# Patient Record
Sex: Male | Born: 1950 | Race: Black or African American | Hispanic: No | Marital: Married | Smoking: Current every day smoker
Health system: Southern US, Community
[De-identification: ages and names within clinical notes are randomized; demographics above are authoritative.]

## PROBLEM LIST (undated history)

## (undated) DIAGNOSIS — D509 Iron deficiency anemia, unspecified: Secondary | ICD-10-CM

## (undated) DIAGNOSIS — I639 Cerebral infarction, unspecified: Secondary | ICD-10-CM

## (undated) DIAGNOSIS — I10 Essential (primary) hypertension: Secondary | ICD-10-CM

## (undated) DIAGNOSIS — E785 Hyperlipidemia, unspecified: Secondary | ICD-10-CM

## (undated) DIAGNOSIS — Z8673 Personal history of transient ischemic attack (TIA), and cerebral infarction without residual deficits: Secondary | ICD-10-CM

## (undated) DIAGNOSIS — K5900 Constipation, unspecified: Secondary | ICD-10-CM

## (undated) DIAGNOSIS — Z720 Tobacco use: Secondary | ICD-10-CM

## (undated) HISTORY — DX: Iron deficiency anemia, unspecified: D50.9

## (undated) HISTORY — DX: Constipation, unspecified: K59.00

## (undated) HISTORY — DX: Cerebral infarction, unspecified: I63.9

## (undated) HISTORY — DX: Tobacco use: Z72.0

## (undated) HISTORY — DX: Essential (primary) hypertension: I10

## (undated) HISTORY — DX: Personal history of transient ischemic attack (TIA), and cerebral infarction without residual deficits: Z86.73

## (undated) HISTORY — DX: Hyperlipidemia, unspecified: E78.5

---

## 2013-08-23 ENCOUNTER — Inpatient Hospital Stay (HOSPITAL_BASED_OUTPATIENT_CLINIC_OR_DEPARTMENT_OTHER)
Admission: EM | Admit: 2013-08-23 | Discharge: 2013-08-25 | DRG: 066 | Disposition: A | Discharge status: Home / Self Care | Payer: 59 | Attending: Internal Medicine | Admitting: Internal Medicine

## 2013-08-23 ENCOUNTER — Emergency Department (HOSPITAL_BASED_OUTPATIENT_CLINIC_OR_DEPARTMENT_OTHER): Payer: 59

## 2013-08-23 ENCOUNTER — Encounter (HOSPITAL_BASED_OUTPATIENT_CLINIC_OR_DEPARTMENT_OTHER): Payer: Self-pay | Admitting: Emergency Medicine

## 2013-08-23 ENCOUNTER — Observation Stay (HOSPITAL_COMMUNITY): Payer: 59

## 2013-08-23 DIAGNOSIS — F17200 Nicotine dependence, unspecified, uncomplicated: Secondary | ICD-10-CM | POA: Diagnosis present

## 2013-08-23 DIAGNOSIS — D72829 Elevated white blood cell count, unspecified: Secondary | ICD-10-CM | POA: Diagnosis present

## 2013-08-23 DIAGNOSIS — R209 Unspecified disturbances of skin sensation: Secondary | ICD-10-CM | POA: Diagnosis present

## 2013-08-23 DIAGNOSIS — G459 Transient cerebral ischemic attack, unspecified: Secondary | ICD-10-CM

## 2013-08-23 DIAGNOSIS — I10 Essential (primary) hypertension: Secondary | ICD-10-CM

## 2013-08-23 DIAGNOSIS — I519 Heart disease, unspecified: Secondary | ICD-10-CM

## 2013-08-23 DIAGNOSIS — I639 Cerebral infarction, unspecified: Secondary | ICD-10-CM

## 2013-08-23 DIAGNOSIS — I6340 Cerebral infarction due to embolism of unspecified cerebral artery: Principal | ICD-10-CM | POA: Diagnosis present

## 2013-08-23 DIAGNOSIS — R29898 Other symptoms and signs involving the musculoskeletal system: Secondary | ICD-10-CM

## 2013-08-23 DIAGNOSIS — D649 Anemia, unspecified: Secondary | ICD-10-CM

## 2013-08-23 HISTORY — DX: Cerebral infarction, unspecified: I63.9

## 2013-08-23 LAB — RAPID URINE DRUG SCREEN, HOSP PERFORMED
Amphetamines: NOT DETECTED
BARBITURATES: NOT DETECTED
BENZODIAZEPINES: NOT DETECTED
COCAINE: NOT DETECTED
Opiates: NOT DETECTED
TETRAHYDROCANNABINOL: NOT DETECTED

## 2013-08-23 LAB — URINALYSIS, ROUTINE W REFLEX MICROSCOPIC
BILIRUBIN URINE: NEGATIVE
Glucose, UA: NEGATIVE mg/dL
HGB URINE DIPSTICK: NEGATIVE
Ketones, ur: NEGATIVE mg/dL
Leukocytes, UA: NEGATIVE
Nitrite: NEGATIVE
PH: 6 (ref 5.0–8.0)
Protein, ur: NEGATIVE mg/dL
SPECIFIC GRAVITY, URINE: 1.019 (ref 1.005–1.030)
UROBILINOGEN UA: 1 mg/dL (ref 0.0–1.0)

## 2013-08-23 LAB — CBC
HCT: 31.6 % — ABNORMAL LOW (ref 39.0–52.0)
HCT: 29.9 % — ABNORMAL LOW (ref 39.0–52.0)
Hemoglobin: 9.3 g/dL — ABNORMAL LOW (ref 13.0–17.0)
Hemoglobin: 8.5 g/dL — ABNORMAL LOW (ref 13.0–17.0)
MCH: 21.8 pg — AB (ref 26.0–34.0)
MCH: 20.6 pg — AB (ref 26.0–34.0)
MCHC: 29.4 g/dL — AB (ref 30.0–36.0)
MCHC: 28.4 g/dL — ABNORMAL LOW (ref 30.0–36.0)
MCV: 74 fL — ABNORMAL LOW (ref 78.0–100.0)
MCV: 72.4 fL — AB (ref 78.0–100.0)
PLATELETS: 367 10*3/uL (ref 150–400)
PLATELETS: 299 10*3/uL (ref 150–400)
RBC: 4.27 MIL/uL (ref 4.22–5.81)
RBC: 4.13 MIL/uL — AB (ref 4.22–5.81)
RDW: 18.5 % — AB (ref 11.5–15.5)
RDW: 17.9 % — ABNORMAL HIGH (ref 11.5–15.5)
WBC: 11.7 10*3/uL — ABNORMAL HIGH (ref 4.0–10.5)
WBC: 13.5 10*3/uL — ABNORMAL HIGH (ref 4.0–10.5)

## 2013-08-23 LAB — GLUCOSE, CAPILLARY
GLUCOSE-CAPILLARY: 115 mg/dL — AB (ref 70–99)
Glucose-Capillary: 110 mg/dL — ABNORMAL HIGH (ref 70–99)
Glucose-Capillary: 137 mg/dL — ABNORMAL HIGH (ref 70–99)

## 2013-08-23 LAB — HEMOGLOBIN A1C
Hgb A1c MFr Bld: 5.8 % — ABNORMAL HIGH (ref <5.7)
Hgb A1c MFr Bld: 5.9 % — ABNORMAL HIGH (ref <5.7)
MEAN PLASMA GLUCOSE: 123 mg/dL — AB (ref <117)
Mean Plasma Glucose: 120 mg/dL — ABNORMAL HIGH (ref <117)

## 2013-08-23 LAB — DIFFERENTIAL
BASOS ABS: 0.1 10*3/uL (ref 0.0–0.1)
BASOS PCT: 0 % (ref 0–1)
EOS ABS: 0.1 10*3/uL (ref 0.0–0.7)
Eosinophils Relative: 1 % (ref 0–5)
Lymphocytes Relative: 20 % (ref 12–46)
Lymphs Abs: 2.4 10*3/uL (ref 0.7–4.0)
Monocytes Absolute: 1 10*3/uL (ref 0.1–1.0)
Monocytes Relative: 9 % (ref 3–12)
NEUTROS ABS: 8.1 10*3/uL — AB (ref 1.7–7.7)
NEUTROS PCT: 69 % (ref 43–77)

## 2013-08-23 LAB — ETHANOL: Alcohol, Ethyl (B): <11 mg/dL (ref 0–11)

## 2013-08-23 LAB — CREATININE, SERUM
Creatinine, Ser: 0.7 mg/dL (ref 0.50–1.35)
GFR calc Af Amer: >90 mL/min (ref >90)
GFR calc non Af Amer: >90 mL/min (ref >90)

## 2013-08-23 LAB — COMPREHENSIVE METABOLIC PANEL
ALBUMIN: 3.9 g/dL (ref 3.5–5.2)
ALT: 17 U/L (ref 0–53)
AST: 31 U/L (ref 0–37)
Alkaline Phosphatase: 96 U/L (ref 39–117)
BUN: 8 mg/dL (ref 6–23)
CALCIUM: 9.3 mg/dL (ref 8.4–10.5)
CO2: 20 mEq/L (ref 19–32)
CREATININE: 0.8 mg/dL (ref 0.50–1.35)
Chloride: 102 mEq/L (ref 96–112)
GFR calc Af Amer: >90 mL/min (ref >90)
GFR calc non Af Amer: >90 mL/min (ref >90)
Glucose, Bld: 148 mg/dL — ABNORMAL HIGH (ref 70–99)
Potassium: 4.1 mEq/L (ref 3.7–5.3)
Sodium: 141 mEq/L (ref 137–147)
Total Bilirubin: 0.4 mg/dL (ref 0.3–1.2)
Total Protein: 8.5 g/dL — ABNORMAL HIGH (ref 6.0–8.3)

## 2013-08-23 LAB — PROTIME-INR
INR: 1.08 (ref 0.00–1.49)
PROTHROMBIN TIME: 13.8 s (ref 11.6–15.2)

## 2013-08-23 LAB — APTT: APTT: 25 s (ref 24–37)

## 2013-08-23 MED ORDER — MORPHINE SULFATE 2 MG/ML IJ SOLN
2.0000 mg | Freq: Once | INTRAMUSCULAR | Status: AC
  Administered 2013-08-23: 2 mg via INTRAVENOUS
  Filled 2013-08-23: qty 1

## 2013-08-23 MED ORDER — FAMOTIDINE 20 MG PO TABS
20.0000 mg | ORAL_TABLET | Freq: Two times a day (BID) | ORAL | Status: DC | PRN
  Filled 2013-08-23: qty 1

## 2013-08-23 MED ORDER — ENOXAPARIN SODIUM 40 MG/0.4ML ~~LOC~~ SOLN
40.0000 mg | Freq: Every day | SUBCUTANEOUS | Status: DC
  Administered 2013-08-23 – 2013-08-25 (×3): 40 mg via SUBCUTANEOUS
  Filled 2013-08-23 (×4): qty 0.4

## 2013-08-23 MED ORDER — SODIUM CHLORIDE 0.9 % IV SOLN: INTRAVENOUS | Status: DC

## 2013-08-23 MED ORDER — PNEUMOCOCCAL VAC POLYVALENT 25 MCG/0.5ML IJ INJ
0.5000 mL | INJECTION | INTRAMUSCULAR | Status: AC
  Administered 2013-08-24: 0.5 mL via INTRAMUSCULAR
  Filled 2013-08-23: qty 0.5

## 2013-08-23 MED ORDER — ASPIRIN EC 81 MG PO TBEC
81.0000 mg | DELAYED_RELEASE_TABLET | Freq: Every day | ORAL | Status: DC
  Administered 2013-08-23 – 2013-08-25 (×3): 81 mg via ORAL
  Filled 2013-08-23 (×3): qty 1

## 2013-08-23 NOTE — ED Notes (Signed)
[facility] here for pt transport

## 2013-08-23 NOTE — Progress Notes (Signed)
Report received from [place] in the ER.  Awaiting patients arrival to 3W31.  Dirk Dress 08/23/2013 8:07 AM

## 2013-08-23 NOTE — Progress Notes (Signed)
VASCULAR LAB PRELIMINARY  PRELIMINARY  PRELIMINARY  PRELIMINARY  Carotid duplex completed.    Preliminary report:  Bilateral:  1-39% ICA stenosis.  Vertebral artery flow is antegrade.     [place], [place] 08/23/2013, 12:48 PM

## 2013-08-23 NOTE — Progress Notes (Signed)
Follow up note:   MRI showing several small, acute infarcts in the bilateral frontal lobes and right parietal and occipital lobes, suggestive of emboli. Spoke with Dr Leonie Man of Neurology, recommended TEE. Cardiology has been consulted.

## 2013-08-23 NOTE — Consult Note (Addendum)
Referring Physician: [facility]    Chief Complaint: CODE STROKE: RIGHT FOREARM-HAND NUMBNESS AND WEAKNESS.  HPI:                                                                                                                                         Kirk Carson is an 63 y.o. male, right handed, unknown past medical history as he hasn't see a physician in many years, smoker, brought in by EMS as a code stroke due to acute onset of the above stated symptoms. Initial evaluation took place at [facility] and transferred to [facility] for further management. Never had similar symptoms before. He indicated that he was at work when around 4 am developed sudden onset of numbness from the right elbow down to the entire forearm-hand which was associated with inability to use the right hand. Had a " tight" sensation in the right upper abdomen. Stated that those symptoms rapidly improved and went away approximately 1 hour after onset. There was not associated weakness-numbness of the right face or right LE, no HA, vertigo, double vision, difficulty swallowing, slurred speech, imbalance, language or visual disturbances.  Upon arrival to [facility] had NIHSS 0. CT brain showed no acute abnormality.  Date last known well: 08/23/13 Time last known well: 4 am tPA Given: no, symptoms resolved NIHSS: 0 MRS: 0  History reviewed. No pertinent past medical history.  History reviewed. No pertinent past surgical history.  History reviewed. No pertinent family history. Social History:  reports that he has never smoked. He does not have any smokeless tobacco history on file. He reports that he does not drink alcohol or use illicit drugs.  Allergies: Allergies not on file  Medications:                                                                                                                           I have reviewed the patient's current medications.  ROS:  History obtained from the patient  General ROS: negative for - chills, fatigue, fever, night sweats, weight gain or weight loss Psychological ROS: negative for - behavioral disorder, hallucinations, memory difficulties, mood swings or suicidal ideation Ophthalmic ROS: negative for - blurry vision, double vision, eye pain or loss of vision ENT ROS: negative for - epistaxis, nasal discharge, oral lesions, sore throat, tinnitus or vertigo Allergy and Immunology ROS: negative for - hives or itchy/watery eyes Hematological and Lymphatic ROS: negative for - bleeding problems, bruising or swollen lymph nodes Endocrine ROS: negative for - galactorrhea, hair pattern changes, polydipsia/polyuria or temperature intolerance Respiratory ROS: negative for - cough, hemoptysis, shortness of breath or wheezing Cardiovascular ROS: negative for - chest pain, dyspnea on exertion, edema or irregular heartbeat Gastrointestinal ROS: negative for - abdominal pain, diarrhea, hematemesis, nausea/vomiting or stool incontinence Genito-Urinary ROS: negative for - dysuria, hematuria, incontinence or urinary frequency/urgency Musculoskeletal ROS: negative for - joint swelling or muscular weakness Neurological ROS: as noted in HPI Dermatological ROS: negative for rash and skin lesion changes  Physical exam: pleasant male in no apparent distress. Blood pressure 164/79, pulse 103, temperature 98.1 F (36.7 C), temperature source Oral, resp. rate 24, height _0  (1.702 m), weight 77.111 kg (170 lb), SpO2 100.00%. Head: normocephalic. Neck: supple, no bruits, no JVD. Cardiac: no murmurs. Lungs: clear. Abdomen: soft, no tender, no mass. Extremities: no edema. CV: pulses palpable throughout  Neurologic Examination:                                                                                                      Mental Status: Alert, oriented, thought content  appropriate.  Speech fluent without evidence of aphasia.  Able to follow 3 step commands without difficulty. Cranial Nerves: II: Discs flat bilaterally; Visual fields grossly normal, pupils equal, round, reactive to light and accommodation III,IV, VI: ptosis not present, extra-ocular motions intact bilaterally V,VII: smile symmetric, facial light touch sensation normal bilaterally VIII: hearing normal bilaterally IX,X: gag reflex present XI: bilateral shoulder shrug XII: midline tongue extension without atrophy or fasciculations  Motor: Right : Upper extremity   5/5    Left:     Upper extremity   5/5  Lower extremity   5/5     Lower extremity   5/5 Tone and bulk:normal tone throughout; no atrophy noted Sensory: Pinprick and light touch intact throughout, bilaterally Deep Tendon Reflexes:  Right: Upper Extremity   Left: Upper extremity   biceps (C-5 to C-6) 2/4   biceps (C-5 to C-6) 2/4 tricep (C7) 2/4    triceps (C7) 2/4 Brachioradialis (C6) 2/4  Brachioradialis (C6) 2/4  Lower Extremity Lower Extremity  quadriceps (L-2 to L-4) 2/4   quadriceps (L-2 to L-4) 2/4 Achilles (S1) 2/4   Achilles (S1) 2/4  Plantars: Right: downgoing   Left: downgoing Cerebellar: normal finger-to-nose,  normal heel-to-shin test Gait:  No tested.   Results for orders placed during the hospital encounter of 08/23/13 (from the past 48 hour(s))  ETHANOL     Status: None   Collection Time    08/23/13  4:45 AM      Result Value Ref Range   Alcohol, Ethyl (B) <11  0 - 11 mg/dL   Comment:            LOWEST DETECTABLE LIMIT FOR     SERUM ALCOHOL IS 11 mg/dL     FOR MEDICAL PURPOSES ONLY  PROTIME-INR     Status: None   Collection Time    08/23/13  4:45 AM      Result Value Ref Range   Prothrombin Time 13.8  11.6 - 15.2 seconds   INR 1.08  0.00 - 1.49  APTT     Status: None   Collection Time    08/23/13  4:45 AM      Result Value Ref Range   aPTT 25  24 - 37 seconds  CBC     Status: Abnormal    Collection Time    08/23/13  4:45 AM      Result Value Ref Range   WBC 11.7 (*) 4.0 - 10.5 K/uL   RBC 4.27  4.22 - 5.81 MIL/uL   Hemoglobin 9.3 (*) 13.0 - 17.0 g/dL   HCT 31.6 (*) 39.0 - 52.0 %   MCV 74.0 (*) 78.0 - 100.0 fL   MCH 21.8 (*) 26.0 - 34.0 pg   MCHC 29.4 (*) 30.0 - 36.0 g/dL   RDW 18.5 (*) 11.5 - 15.5 %   Platelets 367  150 - 400 K/uL  DIFFERENTIAL     Status: Abnormal   Collection Time    08/23/13  4:45 AM      Result Value Ref Range   Neutrophils Relative % 69  43 - 77 %   Neutro Abs 8.1 (*) 1.7 - 7.7 K/uL   Lymphocytes Relative 20  12 - 46 %   Lymphs Abs 2.4  0.7 - 4.0 K/uL   Monocytes Relative 9  3 - 12 %   Monocytes Absolute 1.0  0.1 - 1.0 K/uL   Eosinophils Relative 1  0 - 5 %   Eosinophils Absolute 0.1  0.0 - 0.7 K/uL   Basophils Relative 0  0 - 1 %   Basophils Absolute 0.1  0.0 - 0.1 K/uL   WBC Morphology WHITE COUNT CONFIRMED ON SMEAR     RBC Morphology STOMATOCYTES     Comment: ELLIPTOCYTES   Smear Review PLATELET COUNT CONFIRMED BY SMEAR    COMPREHENSIVE METABOLIC PANEL     Status: Abnormal   Collection Time    08/23/13  4:45 AM      Result Value Ref Range   Sodium 141  137 - 147 mEq/L   Potassium 4.1  3.7 - 5.3 mEq/L   Chloride 102  96 - 112 mEq/L   CO2 20  19 - 32 mEq/L   Glucose, Bld 148 (*) 70 - 99 mg/dL   BUN 8  6 - 23 mg/dL   Creatinine, Ser 0.80  0.50 - 1.35 mg/dL   Calcium 9.3  8.4 - 10.5 mg/dL   Total Protein 8.5 (*) 6.0 - 8.3 g/dL   Albumin 3.9  3.5 - 5.2 g/dL   AST 31  0 - 37 U/L   ALT 17  0 - 53 U/L   Alkaline Phosphatase 96  39 - 117 U/L   Total Bilirubin 0.4  0.3 - 1.2 mg/dL   GFR calc non Af Amer >90  >90 mL/min   GFR calc Af Amer >90  >90 mL/min   Comment: (NOTE)  The eGFR has been calculated using the CKD EPI equation.     This calculation has not been validated in all clinical situations.     eGFR's persistently <90 mL/min signify possible Chronic Kidney     Disease.   Ct Head Wo Contrast  08/23/2013   CLINICAL DATA:   Code stroke. Right arm pain and weakness onset 45 min ago.  EXAM: CT HEAD WITHOUT CONTRAST  TECHNIQUE: Contiguous axial images were obtained from the base of the skull through the vertex without intravenous contrast.  COMPARISON:  None.  FINDINGS: Mild cerebral atrophy and white matter changes. No ventricular dilatation. No mass effect or midline shift. No abnormal extra-axial fluid collections. Gray-white matter junctions are distinct. Basal cisterns are not effaced. No evidence of acute intracranial hemorrhage. No depressed skull fractures. Visualized paranasal sinuses and mastoid air cells are not opacified.  IMPRESSION: No acute intracranial abnormalities.  Code stroke results called to Dr. Lita Mains at [facility] hr on 08/23/2013.   Electronically Signed   By: Lucienne Capers M.D.   On: 08/23/2013 05:03    Assessment: 63 y.o. male with possible left brain TIA characterized by right forearm-arm numbness and had weakness. Differential includes an entrapment neuropathy, although he said he never had similar similar symptoms and the onset was rather abrupt which speaks in favor or a cerebrovascular event. No a candidate for thrombolysis due to symptoms resolution. ABCD2 score 6. Admit to medicine. ASA. Complete TIA work up.  Stroke Risk Factors - smoking  Plan: 1. HgbA1c, fasting lipid panel 2. MRI, MRA  of the brain without contrast 3. Echocardiogram 4. Carotid dopplers 5. Prophylactic therapy-aspirin 81 mg daily 6. Risk factor modification 7. Telemetry monitoring 8. Frequent neuro checks 9. PT/OT SLP ( no needed at this time, symptoms had resolved)  Dorian Pod, MD [facility] [phone]  08/23/2013, 5:44 AM

## 2013-08-23 NOTE — H&P (Addendum)
[facility] History and Physical  Lawayne Munyon [id number] DOB: 1950-07-26 DOA: 08/23/2013  Referring physician:  PCP: Pcp Not In System   Chief Complaint: Right upper extremity paresthesias  HPI: Aquarius Purdie is a 63 y.o. male with no significant past medical history who was brought to the emergency room by EMS this morning with complaints of right upper extremity numbness and tingling. He does not have a primary care provider, denies significant medical problems, presently not on prescription medications, who was in his usual state health until this morning where he noted numbness and tingling over his right elbow and forearm, associated with right hand weakness. Symptoms occurred at 4:00 this morning while at home. He denied prior episodes. Symptoms lasted for approximately 1 hour. Patient was brought to the emergency room by EMS as a code CVA was called. Patient's symptoms resolving by the time he reached the emergency department. Initial workup included an EKG which showed sinus rhythm. A CT scan of brain without contrast showed no acute intracranial abnormality.                                                                                                                       Review of Systems:  Constitutional:  No weight loss, night sweats, Fevers, chills, fatigue.  HEENT:  No headaches, Difficulty swallowing,Tooth/dental problems,Sore throat,  No sneezing, itching, ear ache, nasal congestion, post nasal drip,  Cardio-vascular:  No chest pain, Orthopnea, PND, swelling in lower extremities, anasarca, dizziness, palpitations  GI:  No heartburn, indigestion, abdominal pain, nausea, vomiting, diarrhea, change in bowel habits, loss of appetite  Resp:  No shortness of breath with exertion or at rest. No excess mucus, no productive cough, No non-productive cough, No coughing up of blood.No change in color of mucus.No wheezing.No chest wall deformity  Skin:  no rash or  lesions.  GU:  no dysuria, change in color of urine, no urgency or frequency. No flank pain.  Musculoskeletal:  No joint pain or swelling. No decreased range of motion. No back pain.  Psych:  No change in mood or affect. No depression or anxiety. No memory loss.   History reviewed. No pertinent past medical history. History reviewed. No pertinent past surgical history. Social History:  reports that he has never smoked. He does not have any smokeless tobacco history on file. He reports that he does not drink alcohol or use illicit drugs.  No Known Allergies  History reviewed. No pertinent family history.   Prior to Admission medications   Medication Sig Start Date End Date Taking? Authorizing Provider  acetaminophen (TYLENOL) 325 MG tablet Take 650 mg by mouth every 6 (six) hours as needed for mild pain.   Yes Historical Provider, MD   Physical Exam: Filed Vitals:   08/23/13 0537  BP: 164/79  Pulse: 103  Temp:   Resp: 24    BP 164/79  Pulse 103  Temp(Src) 98.1 F (36.7 C) (Oral)  Resp 24  Ht 5\' 7"  (1.702 m)  Wt 77.111 kg (170 lb)  BMI 26.62 kg/m2  SpO2 100%  General:  Appears calm and comfortable Eyes: PERRL, normal lids, irises & conjunctiva ENT: grossly normal hearing, lips & tongue Neck: no LAD, masses or thyromegaly Cardiovascular: RRR, no m/r/g. No LE edema. Telemetry: SR, no arrhythmias  Respiratory: CTA bilaterally, no w/r/r. Normal respiratory effort. Abdomen: soft, ntnd Skin: no rash or induration seen on limited exam Musculoskeletal: grossly normal tone BUE/BLE, clubbing noted Psychiatric: grossly normal mood and affect, speech fluent and appropriate Neurologic: Cranial nerves II through XII are grossly intact, I do not no facial droop or slurred speech. No tongue deviation or nuchal rigidity. He appears to have 4-5 muscle strength to right upper and right lower extremity without alteration to sensation, had 2+ bilateral deep tendon reflexes. Finger to nose  intact.           Labs on Admission:  Basic Metabolic Panel:  Recent Labs Lab 08/23/13 0445  NA 141  K 4.1  CL 102  CO2 20  GLUCOSE 148*  BUN 8  CREATININE 0.80  CALCIUM 9.3   Liver Function Tests:  Recent Labs Lab 08/23/13 0445  AST 31  ALT 17  ALKPHOS 96  BILITOT 0.4  PROT 8.5*  ALBUMIN 3.9   No results found for this basename: LIPASE, AMYLASE,  in the last 168 hours No results found for this basename: AMMONIA,  in the last 168 hours CBC:  Recent Labs Lab 08/23/13 0445  WBC 11.7*  NEUTROABS 8.1*  HGB 9.3*  HCT 31.6*  MCV 74.0*  PLT 367   Cardiac Enzymes: No results found for this basename: CKTOTAL, CKMB, CKMBINDEX, TROPONINI,  in the last 168 hours  BNP (last 3 results) No results found for this basename: PROBNP,  in the last 8760 hours CBG: No results found for this basename: GLUCAP,  in the last 168 hours  Radiological Exams on Admission: Ct Head Wo Contrast  08/23/2013   CLINICAL DATA:  Code stroke. Right arm pain and weakness onset 45 min ago.  EXAM: CT HEAD WITHOUT CONTRAST  TECHNIQUE: Contiguous axial images were obtained from the base of the skull through the vertex without intravenous contrast.  COMPARISON:  None.  FINDINGS: Mild cerebral atrophy and white matter changes. No ventricular dilatation. No mass effect or midline shift. No abnormal extra-axial fluid collections. Gray-white matter junctions are distinct. Basal cisterns are not effaced. No evidence of acute intracranial hemorrhage. No depressed skull fractures. Visualized paranasal sinuses and mastoid air cells are not opacified.  IMPRESSION: No acute intracranial abnormalities.  Code stroke results called to Dr. Lita Mains at [facility] hr on 08/23/2013.   Electronically Signed   By: Lucienne Capers M.D.   On: 08/23/2013 05:03    EKG: Independently reviewed. Sinus rhythm  Assessment/Plan Principal Problem:   TIA (transient ischemic attack) Active Problems:   Hypertension   1. Transient  ischemic attack. Patient with positive in past medical history presenting with sudden onset right upper extremity numbness associated with right hand weakness lasted approximately one hour. Symptoms resolving by the time he reached the emergency room. Patient was seen and evaluated by Dr. Armida Sans of neurology in the emergency room. Will place patient in overnight ops, continuous cardiac monitoring, follow TIA protocol. We'll obtain an MRI/MRA of brain without contrast, transthoracic echocardiogram, carotid Dopplers, hemoglobin A1c and fasting lipid panel. Obtain neuro checks every 2 hours x3 occurrences. Neurology recommending prophylactic therapy with aspirin 81 mg by mouth daily. Will followup on studies. 2. Hypertension.  Patient presenting with elevated blood pressures of 164/79. Will hold off on pharmacologic therapy to allow for permissive hypertension and favor cerebral perfusion. Bring down blood pressure slowly, consider adding antihypertensive agent tomorrow morning. 3. Cough. Patient complain of chronic cough, is a smoker. Note clubbing on physical exam. Will check chest x-ray. 4. DVT prophylaxis. Lovenox    Code Status: Full Code Family Communication:  Disposition Plan:  24 hour obs, do not anticipate patient requiring greater than 2 nights hospitalization  Time spent: 52 min  [facility] Pager [phone]

## 2013-08-23 NOTE — Code Documentation (Signed)
Code Stroke called at 0457.  Pt arrived at [facility] [place], LSW at 0400, Head CT and Labs completed at [facility].  Results of CT with no intracranial abnormalities read to Dr Lita Mains at [facility] at [facility] hrs.    Pt has no known medical history, rarely see a physician, does smoke.  Pt states he was at work at [employer] when he developed sudden  numbness and tingling  In his RUE from elbow to hand .Denied pain in RUE  but stated he had also experience muscle  tightness in his RUQ.  He had gone outside to smoke a cigarette but his hand was too weak to strike the match.  Symptoms raapidly improved and were gone within an hour.   Up on arrival to [facility] pt was reported to be diaphoretic with BP 185/ 109 and had decreased grip strength in his right hand.  Denies slurred speech (pt is edentulous)  or vision changes, no facial numbness, no headaches.     CBG 131  Upon arrival to [facility] at 0525  pt's symptoms had resolved.  NIHSS 0.  TIA alert.  Admit to medicine service.

## 2013-08-23 NOTE — Progress Notes (Signed)
Patient arrived to unit per stretcher accompanied by ER nurse.  Patient alert, oriented, verbally responsive, breathing regular and non-labored throughout.  No s/s of distress noted throughout, no c/o pain throughout.  Patient oriented to unit and room, needs attended to.  VS WNL.  Will continue to monitor.  Dirk Dress 08/23/2013

## 2013-08-23 NOTE — Progress Notes (Signed)
  Echocardiogram 2D Echocardiogram has been performed.  Valinda Hoar 08/23/2013, 1:15 PM

## 2013-08-23 NOTE — ED Notes (Signed)
Pt returned from MRI °

## 2013-08-23 NOTE — ED Notes (Signed)
Neurology MD and rapid response RN at bedside.

## 2013-08-23 NOTE — ED Notes (Signed)
Report given to woody, Agricultural consultant at [employer]. Pt transported via ems, upon , leaving left arm numbness had resided and pt had increased grip strength to that right hand, pt able to lift arm and keep arm elevated

## 2013-08-23 NOTE — ED Provider Notes (Signed)
6:09 AM Resolution of symptoms at this time.  Will omit for a TIA.  Patient has no primary care physician.  I appreciate assistance of neurology.  Please see neurology consultation note for complete details.  Results for orders placed during the hospital encounter of 08/23/13  ETHANOL      Result Value Ref Range   Alcohol, Ethyl (B) <11  0 - 11 mg/dL  PROTIME-INR      Result Value Ref Range   Prothrombin Time 13.8  11.6 - 15.2 seconds   INR 1.08  0.00 - 1.49  APTT      Result Value Ref Range   aPTT 25  24 - 37 seconds  CBC      Result Value Ref Range   WBC 11.7 (*) 4.0 - 10.5 K/uL   RBC 4.27  4.22 - 5.81 MIL/uL   Hemoglobin 9.3 (*) 13.0 - 17.0 g/dL   HCT 31.6 (*) 39.0 - 52.0 %   MCV 74.0 (*) 78.0 - 100.0 fL   MCH 21.8 (*) 26.0 - 34.0 pg   MCHC 29.4 (*) 30.0 - 36.0 g/dL   RDW 18.5 (*) 11.5 - 15.5 %   Platelets 367  150 - 400 K/uL  DIFFERENTIAL      Result Value Ref Range   Neutrophils Relative % 69  43 - 77 %   Neutro Abs 8.1 (*) 1.7 - 7.7 K/uL   Lymphocytes Relative 20  12 - 46 %   Lymphs Abs 2.4  0.7 - 4.0 K/uL   Monocytes Relative 9  3 - 12 %   Monocytes Absolute 1.0  0.1 - 1.0 K/uL   Eosinophils Relative 1  0 - 5 %   Eosinophils Absolute 0.1  0.0 - 0.7 K/uL   Basophils Relative 0  0 - 1 %   Basophils Absolute 0.1  0.0 - 0.1 K/uL   WBC Morphology WHITE COUNT CONFIRMED ON SMEAR     RBC Morphology STOMATOCYTES     Smear Review PLATELET COUNT CONFIRMED BY SMEAR    COMPREHENSIVE METABOLIC PANEL      Result Value Ref Range   Sodium 141  137 - 147 mEq/L   Potassium 4.1  3.7 - 5.3 mEq/L   Chloride 102  96 - 112 mEq/L   CO2 20  19 - 32 mEq/L   Glucose, Bld 148 (*) 70 - 99 mg/dL   BUN 8  6 - 23 mg/dL   Creatinine, Ser 0.80  0.50 - 1.35 mg/dL   Calcium 9.3  8.4 - 10.5 mg/dL   Total Protein 8.5 (*) 6.0 - 8.3 g/dL   Albumin 3.9  3.5 - 5.2 g/dL   AST 31  0 - 37 U/L   ALT 17  0 - 53 U/L   Alkaline Phosphatase 96  39 - 117 U/L   Total Bilirubin 0.4  0.3 - 1.2 mg/dL   GFR  calc non Af Amer >90  >90 mL/min   GFR calc Af Amer >90  >90 mL/min    Ct Head Wo Contrast  08/23/2013   CLINICAL DATA:  Code stroke. Right arm pain and weakness onset 45 min ago.  EXAM: CT HEAD WITHOUT CONTRAST  TECHNIQUE: Contiguous axial images were obtained from the base of the skull through the vertex without intravenous contrast.  COMPARISON:  None.  FINDINGS: Mild cerebral atrophy and white matter changes. No ventricular dilatation. No mass effect or midline shift. No abnormal extra-axial fluid collections. Gray-white matter junctions are distinct. Basal  cisterns are not effaced. No evidence of acute intracranial hemorrhage. No depressed skull fractures. Visualized paranasal sinuses and mastoid air cells are not opacified.  IMPRESSION: No acute intracranial abnormalities.  Code stroke results called to Dr. Lita Mains at [facility] hr on 08/23/2013.   Electronically Signed   By: Lucienne Capers M.D.   On: 08/23/2013 05:03  I personally reviewed the imaging tests through PACS system I reviewed available ER/hospitalization records through the [place], MD 08/23/13 [phone]

## 2013-08-23 NOTE — ED Provider Notes (Signed)
CSN: [id number]     Arrival date & time 08/23/13  0430 History   First MD Initiated Contact with Patient 08/23/13 [phone]     Chief Complaint  Patient presents with  . Arm Pain     (Consider location/radiation/quality/duration/timing/severity/associated sxs/prior Treatment) HPI Patient is a 63 year old male with no known medical history and takes no regular medication. He presents with right upper extremity paresthesias and weakness starting roughly 30 minutes prior to arrival. Patient states he is unable to give an exact time symptom onset. He had some mild right upper quadrant tightness which has since resolved. He states that the numbness in his hand is improving though his strength is fully returned. He's had no previously similar symptoms. He denies chest pain at any point. He has no slurred speech or vision changes. History reviewed. No pertinent past medical history. History reviewed. No pertinent past surgical history. History reviewed. No pertinent family history. History  Substance Use Topics  . Smoking status: Never Smoker   . Smokeless tobacco: Not on file  . Alcohol Use: No    Review of Systems  Constitutional: Negative for fever and chills.  Eyes: Negative for visual disturbance.  Respiratory: Negative for cough and shortness of breath.   Cardiovascular: Negative for chest pain.  Gastrointestinal: Positive for abdominal pain. Negative for nausea and vomiting.  Musculoskeletal: Negative for back pain, neck pain and neck stiffness.  Skin: Negative for rash and wound.  Neurological: Positive for weakness and numbness. Negative for dizziness, syncope and headaches.  All other systems reviewed and are negative.     Allergies  Review of patient's allergies indicates not on file.  Home Medications   Prior to Admission medications   Not on File   BP 185/109  Pulse 96  Temp(Src) 98.1 F (36.7 C) (Oral)  Resp 21  Ht 5\' 7"  (1.702 m)  Wt 170 lb (77.111 kg)  BMI 26.62  kg/m2  SpO2 99% Physical Exam  Nursing note and vitals reviewed. Constitutional: He is oriented to person, place, and time. He appears well-developed and well-nourished. No distress.  HENT:  Head: Normocephalic and atraumatic.  Mouth/Throat: Oropharynx is clear and moist.  Eyes: EOM are normal. Pupils are equal, round, and reactive to light.  Neck: Normal range of motion. Neck supple.  Cardiovascular: Normal rate and regular rhythm.   Pulmonary/Chest: Effort normal and breath sounds normal. No respiratory distress. He has no wheezes. He has no rales. He exhibits no tenderness.  Abdominal: Soft. Bowel sounds are normal. He exhibits no distension and no mass. There is no tenderness. There is no rebound and no guarding.  Musculoskeletal: Normal range of motion. He exhibits no edema and no tenderness.  Neurological: He is alert and oriented to person, place, and time.  4/5 grip strength on the right compared to 5/5 grip strength on the left. Mild right upper extremity drift. No drift noted in any other extremity. Sensation fully intact to gross touch. Bilateral finger to nose intact. Cranial nerves II through XII grossly intact.  Skin: Skin is warm and dry. No rash noted. No erythema.  Psychiatric: He has a normal mood and affect. His behavior is normal.    ED Course  Procedures (including critical care time) Labs Review Labs Reviewed  ETHANOL  PROTIME-INR  APTT  CBC  DIFFERENTIAL  COMPREHENSIVE METABOLIC PANEL  URINE RAPID DRUG SCREEN (HOSP PERFORMED)  URINALYSIS, ROUTINE W REFLEX MICROSCOPIC  I-STAT CHEM 8, ED  I-STAT TROPOININ, ED  Randolm Idol, ED  Imaging Review No results found.   EKG Interpretation None      Date: 08/23/2013  Rate: 93  Rhythm: normal sinus rhythm  QRS Axis: normal  Intervals: normal  ST/T Wave abnormalities: normal  Conduction Disutrbances:none  Narrative Interpretation:   Old EKG Reviewed: none available   MDM   Final diagnoses:   None    Patient with new-onset right hand paresthesias and weakness. We'll activate code stroke and discuss with neurologist transfer to [facility] ED for evaluation.  Radiologist called with the results of the head CT. No intracranial abnormality is found. Discussed with the ED physician Dr. Venita Sheffield. Will accept the patient in transfer.  Patient's right hand weakness is rapidly improving in the emergency department.  Julianne Rice, MD 08/23/13 [phone]

## 2013-08-23 NOTE — ED Notes (Signed)
Right arm pain and numbness that started while working tonight. Pt also c/o RUQ abd pain that feels like "it just tightens".

## 2013-08-24 ENCOUNTER — Encounter (HOSPITAL_COMMUNITY): Payer: Self-pay | Admitting: *Deleted

## 2013-08-24 ENCOUNTER — Encounter (HOSPITAL_COMMUNITY)
Admission: EM | Disposition: A | Discharge status: Home / Self Care | Payer: Self-pay | Source: Home / Self Care | Attending: Internal Medicine

## 2013-08-24 DIAGNOSIS — I6350 Cerebral infarction due to unspecified occlusion or stenosis of unspecified cerebral artery: Secondary | ICD-10-CM

## 2013-08-24 DIAGNOSIS — I6789 Other cerebrovascular disease: Secondary | ICD-10-CM

## 2013-08-24 DIAGNOSIS — D649 Anemia, unspecified: Secondary | ICD-10-CM

## 2013-08-24 HISTORY — PX: TEE WITHOUT CARDIOVERSION: SHX5443

## 2013-08-24 LAB — GLUCOSE, CAPILLARY
GLUCOSE-CAPILLARY: 128 mg/dL — AB (ref 70–99)
Glucose-Capillary: 93 mg/dL (ref 70–99)
Glucose-Capillary: 101 mg/dL — ABNORMAL HIGH (ref 70–99)
Glucose-Capillary: 111 mg/dL — ABNORMAL HIGH (ref 70–99)

## 2013-08-24 LAB — FERRITIN: FERRITIN: 9 ng/mL — AB (ref 22–322)

## 2013-08-24 LAB — IRON AND TIBC
IRON: 32 ug/dL — AB (ref 42–135)
Saturation Ratios: 7 % — ABNORMAL LOW (ref 20–55)
TIBC: 459 ug/dL — ABNORMAL HIGH (ref 215–435)
UIBC: 427 ug/dL — ABNORMAL HIGH (ref 125–400)

## 2013-08-24 LAB — LIPID PANEL
CHOLESTEROL: 119 mg/dL (ref 0–200)
HDL: 26 mg/dL — ABNORMAL LOW (ref >39)
LDL Cholesterol: 56 mg/dL (ref 0–99)
TRIGLYCERIDES: 184 mg/dL — AB (ref <150)
Total CHOL/HDL Ratio: 4.6 RATIO
VLDL: 37 mg/dL (ref 0–40)

## 2013-08-24 SURGERY — ECHOCARDIOGRAM, TRANSESOPHAGEAL
Anesthesia: Moderate Sedation

## 2013-08-24 MED ORDER — STROKE: EARLY STAGES OF RECOVERY BOOK
Freq: Once | Status: DC
  Filled 2013-08-24: qty 1

## 2013-08-24 MED ORDER — BUTAMBEN-TETRACAINE-BENZOCAINE 2-2-14 % EX AERO
INHALATION_SPRAY | CUTANEOUS | Status: DC | PRN
  Administered 2013-08-24: 2 via TOPICAL

## 2013-08-24 MED ORDER — FENTANYL CITRATE 0.05 MG/ML IJ SOLN
INTRAMUSCULAR | Status: AC
  Filled 2013-08-24: qty 2

## 2013-08-24 MED ORDER — MIDAZOLAM HCL 10 MG/2ML IJ SOLN
INTRAMUSCULAR | Status: DC | PRN
  Administered 2013-08-24 (×2): 2 mg via INTRAVENOUS

## 2013-08-24 MED ORDER — MIDAZOLAM HCL 5 MG/ML IJ SOLN
INTRAMUSCULAR | Status: AC
  Filled 2013-08-24: qty 2

## 2013-08-24 MED ORDER — FENTANYL CITRATE 0.05 MG/ML IJ SOLN
INTRAMUSCULAR | Status: DC | PRN
  Administered 2013-08-24: 50 ug via INTRAVENOUS

## 2013-08-24 NOTE — Interval H&P Note (Signed)
History and Physical Interval Note:  08/24/2013 8:29 AM  Kirk Carson  has presented today for surgery, with the diagnosis of STROKE  The various methods of treatment have been discussed with the patient and family. After consideration of risks, benefits and other options for treatment, the patient has consented to  Procedure(s): TRANSESOPHAGEAL ECHOCARDIOGRAM (TEE) (N/A) as a surgical intervention .  The patient's history has been reviewed, patient examined, no change in status, stable for surgery.  I have reviewed the patient's chart and labs.  Questions were answered to the patient's satisfaction.     Josue Hector

## 2013-08-24 NOTE — Progress Notes (Signed)
UR completed. Patient changed to inpatient- acute CVA

## 2013-08-24 NOTE — Progress Notes (Signed)
At shift assessment, pt complained of 4/10 abdominal pain located in the left middle abdomen that felt like a slight ache.  Pt reporting feeling this pain since admission.  Pt has active bowel sounds, last bowel movement 08/22/13, no nausea or vomiting, and no pain with palpation.  MD notified and 2mg  IV Morphine ordered.  Pt now pain free. Will continue to monitor.

## 2013-08-24 NOTE — Progress Notes (Signed)
[facility] PROGRESS NOTE  Kirk Carson [id number] DOB: [birth year] DOA: 08/23/2013 PCP: Pcp Not In System  Assessment/Plan: 63 y.o. Chronic tobacco use history who was brought to the emergency room by EMS this morning with complaints of right upper extremity numbness and tingling found to have acute CVA   1. Acute CVA; MRI: Several small, acute infarcts in the bilateral frontal lobes and right parietal and occipital lobes, suggestive of emboli.TEE. Doppler US carotids are unremarkable  -symptoms resolved; cont ASA;   2. Cough chronic, tobacco use; mild leukocytosis but afebrile; no SOB, no chest pain  -strongly reccommended to stop smoking; inhaler prn;    3. Anemia chronic, no acute s/s fo bleeding; needs age related screening colonoscopy as outpatient -obtain iron profile; cont monitor   Code Status: full Family Communication:  D/w patient, his wife  (indicate person spoken with, relationship, and if by phone, the number) Disposition Plan: home 24-48 hours    Consultants:  Neurology   Procedures:  none  Antibiotics:  noen (indicate start date, and stop date if known)  HPI/Subjective: none  Objective: Filed Vitals:   08/24/13 0930  BP: 119/69  Pulse: 86  Temp:   Resp: 28    Intake/Output Summary (Last 24 hours) at 08/24/13 1050 Last data filed at 08/24/13 0518  Gross per 24 hour  Intake    480 ml  Output      0 ml  Net    480 ml   Filed Weights   08/23/13 0442 08/24/13 0527  Weight: 77.111 kg (170 lb) 79.379 kg (175 lb)    Exam:   General:  alert  Cardiovascular: s1,s2 rrr  Respiratory: CTA BL  Abdomen: soft,nt,nd   Musculoskeletal: no LE edema   Data Reviewed: Basic Metabolic Panel:  Recent Labs Lab 08/23/13 0445 08/23/13 1204  NA 141  --   K 4.1  --   CL 102  --   CO2 20  --   GLUCOSE 148*  --   BUN 8  --   CREATININE 0.80 0.70  CALCIUM 9.3  --    Liver Function Tests:  Recent Labs Lab 08/23/13 0445  AST 31   ALT 17  ALKPHOS 96  BILITOT 0.4  PROT 8.5*  ALBUMIN 3.9   No results found for this basename: LIPASE, AMYLASE,  in the last 168 hours No results found for this basename: AMMONIA,  in the last 168 hours CBC:  Recent Labs Lab 08/23/13 0445 08/23/13 1204  WBC 11.7* 13.5*  NEUTROABS 8.1*  --   HGB 9.3* 8.5*  HCT 31.6* 29.9*  MCV 74.0* 72.4*  PLT 367 299   Cardiac Enzymes: No results found for this basename: CKTOTAL, CKMB, CKMBINDEX, TROPONINI,  in the last 168 hours BNP (last 3 results) No results found for this basename: PROBNP,  in the last 8760 hours CBG:  Recent Labs Lab 08/23/13 1436 08/23/13 1636 08/23/13 2011 08/24/13 0734  GLUCAP 110* 137* 115* 93    No results found for this or any previous visit (from the past 240 hour(s)).   Studies: Dg Chest 2 View  08/23/2013   CLINICAL DATA:  Cough, smoking history  EXAM: CHEST  2 VIEW  COMPARISON:  None.  FINDINGS: Heart size and vascular pattern are normal. There is moderate interstitial pattern in the lower 2/3 of both lungs with no consolidation effusion or pneumothorax.  IMPRESSION: Bilateral interstitial change of on certain chronicity in the absence of prior studies. This may represent chronic interstitial lung  disease perhaps related to smoking history. Given the well-defined reticular nature of the process, acute atypical pneumonia is considered unlikely.   Electronically Signed   By: Skipper Cliche M.D.   On: 08/23/2013 14:25   Ct Head Wo Contrast  08/23/2013   CLINICAL DATA:  Code stroke. Right arm pain and weakness onset 45 min ago.  EXAM: CT HEAD WITHOUT CONTRAST  TECHNIQUE: Contiguous axial images were obtained from the base of the skull through the vertex without intravenous contrast.  COMPARISON:  None.  FINDINGS: Mild cerebral atrophy and white matter changes. No ventricular dilatation. No mass effect or midline shift. No abnormal extra-axial fluid collections. Gray-white matter junctions are distinct. Basal  cisterns are not effaced. No evidence of acute intracranial hemorrhage. No depressed skull fractures. Visualized paranasal sinuses and mastoid air cells are not opacified.  IMPRESSION: No acute intracranial abnormalities.  Code stroke results called to Dr. Lita Mains at [facility] hr on 08/23/2013.   Electronically Signed   By: Lucienne Capers M.D.   On: 08/23/2013 05:03   Mr Jodene Nam Head Wo Contrast  08/23/2013   CLINICAL DATA:  Right forearm and hand numbness and weakness. Stroke.  EXAM: MRI HEAD WITHOUT CONTRAST  MRA HEAD WITHOUT CONTRAST  TECHNIQUE: Multiplanar, multiecho pulse sequences of the brain and surrounding structures were obtained without intravenous contrast. Angiographic images of the head were obtained using MRA technique without contrast.  COMPARISON:  Head CT 08/23/2013  FINDINGS: MRI HEAD FINDINGS  There are several subcentimeter foci of restricted diffusion involving cortex and subcortical white matter in the frontal lobes as well as right parietal and right occipital lobes, consistent with acute infarcts. A remote, punctate infarct is noted in the superior right cerebellum. Scattered, small foci of T2 hyperintensity in the deep cerebral white matter and pons are nonspecific but compatible with minimal chronic small vessel ischemic disease. Mild age-related cerebral atrophy is present. There is no evidence of intracranial hemorrhage, mass, midline shift, or extra-axial fluid collection.  Orbits are unremarkable. Paranasal sinuses and mastoid air cells are clear. Major intracranial vascular flow voids are preserved.  MRA HEAD FINDINGS  Images are mildly degraded by motion.  The visualized distal vertebral arteries are patent and codominant. Left PICA origin is patent. Right PICA origin also appears grossly patent but is not well evaluated. SCA origins are patent. Basilar artery is patent without stenosis. PCAs are unremarkable. Posterior communicating arteries are not clearly identified.  Internal  carotid arteries are patent from skullbase to carotid termini. Left A1 segment is hypoplastic, with the left A2 predominantly supplied via the anterior communicating artery. ACAs and MCAs are otherwise unremarkable.  IMPRESSION: 1. Several small, acute infarcts in the bilateral frontal lobes and right parietal and occipital lobes, suggestive of emboli. 2. Normal variant MRA as above without evidence of major intracranial arterial occlusion or significant proximal stenosis.   Electronically Signed   By: Logan Bores   On: 08/23/2013 09:26   Mr Brain Wo Contrast  08/23/2013   CLINICAL DATA:  Right forearm and hand numbness and weakness. Stroke.  EXAM: MRI HEAD WITHOUT CONTRAST  MRA HEAD WITHOUT CONTRAST  TECHNIQUE: Multiplanar, multiecho pulse sequences of the brain and surrounding structures were obtained without intravenous contrast. Angiographic images of the head were obtained using MRA technique without contrast.  COMPARISON:  Head CT 08/23/2013  FINDINGS: MRI HEAD FINDINGS  There are several subcentimeter foci of restricted diffusion involving cortex and subcortical white matter in the frontal lobes as well as right parietal and  right occipital lobes, consistent with acute infarcts. A remote, punctate infarct is noted in the superior right cerebellum. Scattered, small foci of T2 hyperintensity in the deep cerebral white matter and pons are nonspecific but compatible with minimal chronic small vessel ischemic disease. Mild age-related cerebral atrophy is present. There is no evidence of intracranial hemorrhage, mass, midline shift, or extra-axial fluid collection.  Orbits are unremarkable. Paranasal sinuses and mastoid air cells are clear. Major intracranial vascular flow voids are preserved.  MRA HEAD FINDINGS  Images are mildly degraded by motion.  The visualized distal vertebral arteries are patent and codominant. Left PICA origin is patent. Right PICA origin also appears grossly patent but is not well  evaluated. SCA origins are patent. Basilar artery is patent without stenosis. PCAs are unremarkable. Posterior communicating arteries are not clearly identified.  Internal carotid arteries are patent from skullbase to carotid termini. Left A1 segment is hypoplastic, with the left A2 predominantly supplied via the anterior communicating artery. ACAs and MCAs are otherwise unremarkable.  IMPRESSION: 1. Several small, acute infarcts in the bilateral frontal lobes and right parietal and occipital lobes, suggestive of emboli. 2. Normal variant MRA as above without evidence of major intracranial arterial occlusion or significant proximal stenosis.   Electronically Signed   By: Logan Bores   On: 08/23/2013 09:26    Scheduled Meds: . aspirin EC  81 mg Oral Daily  . enoxaparin (LOVENOX) injection  40 mg Subcutaneous Daily  . pneumococcal 23 valent vaccine  0.5 mL Intramuscular Tomorrow-1000   Continuous Infusions: . sodium chloride      Principal Problem:   TIA (transient ischemic attack) Active Problems:   Hypertension    Time spent: >35 minutes     Kinnie Feil  [facility] Pager [phone]. If 7PM-7AM, please contact night-coverage at [email or web address], password [facility] 08/24/2013, 10:50 AM  LOS: 1 day

## 2013-08-24 NOTE — CV Procedure (Signed)
   Transesophageal Echocardiogram: Indication: CVA Sedation: Versed: 3, Fentanyl: 25, Other: 0 ASA: 2, Airway: 2  Procedure:  The patient was moderately sedated with the above doses of versed and fentanyl.  Using digital technique an omniplane probe was advanced into the distal esophagus without incident. Transgastric imaging revealed normal LV function with no RWMA;s and no mural apical thrombus. .  Estimated ejection fraction was 65%.  Right sided cardiac chambers were normal with no evidence of pulmonary hypertension.  The pulmonary and tricuspid valves were structurally normal.  There was mild TR The mitral valve was structurally normal with trivial mitral regurgitation.    The aortic valve was trileaflet with no AS/AR The aortic root was normal.    Imaging of the septum showed no ASD or VSD Bubble study was negative for shunt 2D and color flow confirmed no PFO  The LAE was well visualized in orthogonal views.  There was no spontaneous contrast and no thrombus.    The descending thoracic aorta had no mural aortic debris with no evidence of aneurysmal dilation or disection  Impression:  1) No SOE 2) Normal EF 65% 3) Normal aorta no debris 4) Mild TR 5) Trivial MR 6) Negative bubble study 7) Normal Aortic valve 8) Normal atria  Josue Hector 08/24/2013 9:22 AM

## 2013-08-24 NOTE — Care Management Note (Signed)
    Page 1 of 1   08/24/2013     2:18:39 PM CARE MANAGEMENT NOTE 08/24/2013  Patient:  Kirk Carson   Account Number:  [id number]  Date Initiated:  08/24/2013  Documentation initiated by:  GRAVES-BIGELOW,Sander Remedios  Subjective/Objective Assessment:   Pt admitted for Right upper extremity paresthesias. MRI + for several small, acute infarcts in the bilateral frontal lobes and right parietal and occipital lobes, suggestive of emboli.     Action/Plan:   CM did call [facility] for PCP f/u. Appointment set and placed on AVS. No further needs at this time. FC will consult with pt before d/c.   Anticipated DC Date:  08/26/2013   Anticipated DC Plan:  HOME/SELF CARE  In-house referral  Financial Counselor      DC Planning Services  CM consult  Follow-up appt scheduled      Choice offered to / List presented to:             Status of service:  Completed, signed off Medicare Important Message given?  NO (If response is "NO", the following Medicare IM given date fields will be blank) Date Medicare IM given:   Date Additional Medicare IM given:    Discharge Disposition:  HOME/SELF CARE  Per UR Regulation:  Reviewed for med. necessity/level of care/duration of stay  If discussed at [place] of Stay Meetings, dates discussed:    Comments:

## 2013-08-24 NOTE — Progress Notes (Signed)
Stroke Team Progress Note  HISTORY Kirk Carson is an 62 y.o. male, right handed, unknown past medical history as he hasn't see a physician in many years, smoker, brought in by EMS as a code stroke due to acute onset of the above stated symptoms. Initial evaluation took place at [facility] and transferred to [facility] for further management.  Never had similar symptoms before.  He indicated that he was at work when around 4 am developed sudden onset of numbness from the right elbow down to the entire forearm-hand which was associated with inability to use the right hand. Had a " tight" sensation in the right upper abdomen. Stated that those symptoms rapidly improved and went away approximately 1 hour after onset.  There was not associated weakness-numbness of the right face or right LE, no HA, vertigo, double vision, difficulty swallowing, slurred speech, imbalance, language or visual disturbances.  Upon arrival to [facility] had NIHSS 0.  CT brain showed no acute abnormality.  Date last known well: 08/23/13  Time last known well: 4 am  tPA Given: no, symptoms resolved  NIHSS: 0  MRS: 0   . He was admitted to the neuro floor bed for further evaluation and treatment.  SUBJECTIVE His wife is at the bedside.  Overall he feels his condition is rapidly improving. No new complaints  OBJECTIVE Most recent Vital Signs: Filed Vitals:   08/24/13 0919 08/24/13 0930 08/24/13 1424 08/24/13 1651  BP: 115/64 119/69 103/53 117/66  Pulse: 89 86    Temp:   98.2 F (36.8 C) 98.4 F (36.9 C)  TempSrc:   Oral Oral  Resp: 28 28 16 17   Height:      Weight:      SpO2: 98% 96% 100% 97%   CBG (last 3)   Recent Labs  08/24/13 0734 08/24/13 1131 08/24/13 1638  GLUCAP 93 101* 111*    IV Fluid Intake:     MEDICATIONS  . aspirin EC  81 mg Oral Daily  . enoxaparin (LOVENOX) injection  40 mg Subcutaneous Daily   PRN:  famotidine  Diet:  Sodium Restricted   Activity:  Bedrest  DVT Prophylaxis:   lovenox  CLINICALLY SIGNIFICANT STUDIES Basic Metabolic Panel:  Recent Labs Lab 08/23/13 0445 08/23/13 1204  NA 141  --   K 4.1  --   CL 102  --   CO2 20  --   GLUCOSE 148*  --   BUN 8  --   CREATININE 0.80 0.70  CALCIUM 9.3  --    Liver Function Tests:  Recent Labs Lab 08/23/13 0445  AST 31  ALT 17  ALKPHOS 96  BILITOT 0.4  PROT 8.5*  ALBUMIN 3.9   CBC:  Recent Labs Lab 08/23/13 0445 08/23/13 1204  WBC 11.7* 13.5*  NEUTROABS 8.1*  --   HGB 9.3* 8.5*  HCT 31.6* 29.9*  MCV 74.0* 72.4*  PLT 367 299   Coagulation:  Recent Labs Lab 08/23/13 0445  LABPROT 13.8  INR 1.08   Cardiac Enzymes: No results found for this basename: CKTOTAL, CKMB, CKMBINDEX, TROPONINI,  in the last 168 hours Urinalysis:  Recent Labs Lab 08/23/13 0710  COLORURINE YELLOW  LABSPEC 1.019  PHURINE 6.0  GLUCOSEU NEGATIVE  HGBUR NEGATIVE  BILIRUBINUR NEGATIVE  KETONESUR NEGATIVE  PROTEINUR NEGATIVE  UROBILINOGEN 1.0  NITRITE NEGATIVE  LEUKOCYTESUR NEGATIVE   Lipid Panel    Component Value Date/Time   CHOL 119 08/23/2013 2351   TRIG 184* 08/23/2013 2351   HDL 26*  08/23/2013 2351   CHOLHDL 4.6 08/23/2013 2351   VLDL 37 08/23/2013 2351   LDLCALC 56 08/23/2013 2351   HgbA1C  Lab Results  Component Value Date   HGBA1C 5.9* 08/23/2013    Urine Drug Screen:     Component Value Date/Time   LABOPIA NONE DETECTED 08/23/2013 0710   COCAINSCRNUR NONE DETECTED 08/23/2013 0710   LABBENZ NONE DETECTED 08/23/2013 0710   AMPHETMU NONE DETECTED 08/23/2013 0710   THCU NONE DETECTED 08/23/2013 0710   LABBARB NONE DETECTED 08/23/2013 0710    Alcohol Level:  Recent Labs Lab 08/23/13 0445  ETH <11    Dg Chest 2 View  08/23/2013   CLINICAL DATA:  Cough, smoking history  EXAM: CHEST  2 VIEW  COMPARISON:  None.  FINDINGS: Heart size and vascular pattern are normal. There is moderate interstitial pattern in the lower 2/3 of both lungs with no consolidation effusion or pneumothorax.  IMPRESSION:  Bilateral interstitial change of on certain chronicity in the absence of prior studies. This may represent chronic interstitial lung disease perhaps related to smoking history. Given the well-defined reticular nature of the process, acute atypical pneumonia is considered unlikely.   Electronically Signed   By: Skipper Cliche M.D.   On: 08/23/2013 14:25   Ct Head Wo Contrast  08/23/2013   CLINICAL DATA:  Code stroke. Right arm pain and weakness onset 45 min ago.  EXAM: CT HEAD WITHOUT CONTRAST  TECHNIQUE: Contiguous axial images were obtained from the base of the skull through the vertex without intravenous contrast.  COMPARISON:  None.  FINDINGS: Mild cerebral atrophy and white matter changes. No ventricular dilatation. No mass effect or midline shift. No abnormal extra-axial fluid collections. Gray-white matter junctions are distinct. Basal cisterns are not effaced. No evidence of acute intracranial hemorrhage. No depressed skull fractures. Visualized paranasal sinuses and mastoid air cells are not opacified.  IMPRESSION: No acute intracranial abnormalities.  Code stroke results called to Dr. Lita Mains at [facility] hr on 08/23/2013.   Electronically Signed   By: Lucienne Capers M.D.   On: 08/23/2013 05:03   Mr Jodene Nam Head Wo Contrast  08/23/2013   CLINICAL DATA:  Right forearm and hand numbness and weakness. Stroke.  EXAM: MRI HEAD WITHOUT CONTRAST  MRA HEAD WITHOUT CONTRAST  TECHNIQUE: Multiplanar, multiecho pulse sequences of the brain and surrounding structures were obtained without intravenous contrast. Angiographic images of the head were obtained using MRA technique without contrast.  COMPARISON:  Head CT 08/23/2013  FINDINGS: MRI HEAD FINDINGS  There are several subcentimeter foci of restricted diffusion involving cortex and subcortical white matter in the frontal lobes as well as right parietal and right occipital lobes, consistent with acute infarcts. A remote, punctate infarct is noted in the superior  right cerebellum. Scattered, small foci of T2 hyperintensity in the deep cerebral white matter and pons are nonspecific but compatible with minimal chronic small vessel ischemic disease. Mild age-related cerebral atrophy is present. There is no evidence of intracranial hemorrhage, mass, midline shift, or extra-axial fluid collection.  Orbits are unremarkable. Paranasal sinuses and mastoid air cells are clear. Major intracranial vascular flow voids are preserved.  MRA HEAD FINDINGS  Images are mildly degraded by motion.  The visualized distal vertebral arteries are patent and codominant. Left PICA origin is patent. Right PICA origin also appears grossly patent but is not well evaluated. SCA origins are patent. Basilar artery is patent without stenosis. PCAs are unremarkable. Posterior communicating arteries are not clearly identified.  Internal carotid arteries  are patent from skullbase to carotid termini. Left A1 segment is hypoplastic, with the left A2 predominantly supplied via the anterior communicating artery. ACAs and MCAs are otherwise unremarkable.  IMPRESSION: 1. Several small, acute infarcts in the bilateral frontal lobes and right parietal and occipital lobes, suggestive of emboli. 2. Normal variant MRA as above without evidence of major intracranial arterial occlusion or significant proximal stenosis.   Electronically Signed   By: Logan Bores   On: 08/23/2013 09:26   Mr Brain Wo Contrast  08/23/2013   CLINICAL DATA:  Right forearm and hand numbness and weakness. Stroke.  EXAM: MRI HEAD WITHOUT CONTRAST  MRA HEAD WITHOUT CONTRAST  TECHNIQUE: Multiplanar, multiecho pulse sequences of the brain and surrounding structures were obtained without intravenous contrast. Angiographic images of the head were obtained using MRA technique without contrast.  COMPARISON:  Head CT 08/23/2013  FINDINGS: MRI HEAD FINDINGS  There are several subcentimeter foci of restricted diffusion involving cortex and subcortical  white matter in the frontal lobes as well as right parietal and right occipital lobes, consistent with acute infarcts. A remote, punctate infarct is noted in the superior right cerebellum. Scattered, small foci of T2 hyperintensity in the deep cerebral white matter and pons are nonspecific but compatible with minimal chronic small vessel ischemic disease. Mild age-related cerebral atrophy is present. There is no evidence of intracranial hemorrhage, mass, midline shift, or extra-axial fluid collection.  Orbits are unremarkable. Paranasal sinuses and mastoid air cells are clear. Major intracranial vascular flow voids are preserved.  MRA HEAD FINDINGS  Images are mildly degraded by motion.  The visualized distal vertebral arteries are patent and codominant. Left PICA origin is patent. Right PICA origin also appears grossly patent but is not well evaluated. SCA origins are patent. Basilar artery is patent without stenosis. PCAs are unremarkable. Posterior communicating arteries are not clearly identified.  Internal carotid arteries are patent from skullbase to carotid termini. Left A1 segment is hypoplastic, with the left A2 predominantly supplied via the anterior communicating artery. ACAs and MCAs are otherwise unremarkable.  IMPRESSION: 1. Several small, acute infarcts in the bilateral frontal lobes and right parietal and occipital lobes, suggestive of emboli. 2. Normal variant MRA as above without evidence of major intracranial arterial occlusion or significant proximal stenosis.   Electronically Signed   By: Logan Bores   On: 08/23/2013 09:26      2D Echocardiogram  Left ventricle: The cavity size was normal. Systolic function was normal. The estimated ejection fraction was in the range of 55% to 60%. Wall motion was normal; there were no regional wall motion abnormalities. Doppler parameters are consistent with abnormal left ventricular relaxation (grade 1 diastolic dysfunction).    TEE  Left ventricle:  Systolic function was normal. The estimated ejection fraction was in the range of 60% to 65%. Wall motion was normal; there were no regional wall motion abnormalities. - Left atrium: No evidence of thrombus in the atrial cavity or appendage. - Right atrium: No evidence of thrombus in the atrial cavity or appendage.     Carotid Doppler  Bilateral: 1-39% ICA stenosis. Vertebral artery flow is antegrade.    CXR    EKG  23-Aug-2013 04:43:32 [place] [facility]-HPED ROUTINE RECORD Normal sinus rhythm Normal ECG Therapy Recommendations  pending Physical Exam   middle aged [place] african male not in distress.Awake alert. Afebrile. Head is nontraumatic. Neck is supple without bruit. Hearing is normal. Cardiac exam no murmur or gallop. Lungs are clear to auscultation.  Distal pulses are well felt. Neurological Exam ;  Awake  Alert oriented x 3. Normal speech and language.eye movements full without nystagmus.fundi were not visualized. Vision acuity and fields appear normal. Hearing is normal. Palatal movements are normal. Face symmetric. Tongue midline. Normal strength, tone, reflexes and coordination. Normal sensation. Gait deferred. ASSESSMENT Mr. Kirk Carson is a 63 y.o. male presenting with sudden onset of transient right upper extremity weakness and numbness which has now resolved. 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 Infarct felt to be  embolic secondary to unidentified source-likely crytogenic  On no prior to admission. Now on aspirin 81 mg orally every day for secondary stroke prevention. Patient with resultant  No deficits. Stroke work up underway.      LDL 56 mg%      Hospital day # 1  TREATMENT/PLAN  Continue   aspirin 81 mg orally every day for secondary stroke prevention.  [place] home today. Outpatient 30 day cardiac monitor for paroxysmal atrial fibrillation  Follow up in stroke clinic with me in 2 months    Antony Contras,  MD 08/24/2013 5:38 PM     To contact Stroke Continuity provider, please refer to [email or web address] After hours, contact General Neurology

## 2013-08-25 ENCOUNTER — Encounter (HOSPITAL_COMMUNITY): Payer: Self-pay | Admitting: Cardiovascular Disease

## 2013-08-25 LAB — RAPID URINE DRUG SCREEN, HOSP PERFORMED
AMPHETAMINES: NOT DETECTED
Barbiturates: NOT DETECTED
Benzodiazepines: POSITIVE — AB
Cocaine: NOT DETECTED
Opiates: POSITIVE — AB
Tetrahydrocannabinol: NOT DETECTED

## 2013-08-25 LAB — GLUCOSE, CAPILLARY
GLUCOSE-CAPILLARY: 110 mg/dL — AB (ref 70–99)
Glucose-Capillary: 108 mg/dL — ABNORMAL HIGH (ref 70–99)

## 2013-08-25 MED ORDER — FERROUS SULFATE 325 (65 FE) MG PO TABS: 325.0000 mg | ORAL_TABLET | Freq: Three times a day (TID) | ORAL | Status: DC

## 2013-08-25 MED ORDER — ASPIRIN 81 MG PO TBEC: 81.0000 mg | DELAYED_RELEASE_TABLET | Freq: Every day | ORAL | Status: DC

## 2013-08-25 MED ORDER — SIMVASTATIN 40 MG PO TABS: 40.0000 mg | ORAL_TABLET | Freq: Every day | ORAL | Status: DC

## 2013-08-25 MED ORDER — AMLODIPINE BESYLATE 5 MG PO TABS: 5.0000 mg | ORAL_TABLET | Freq: Every day | ORAL | Status: DC

## 2013-08-25 NOTE — Progress Notes (Signed)
Stroke Team Progress Note  HISTORY Kirk Carson is an 63 y.o. male, right handed, unknown past medical history as he hasn't see a physician in many years, smoker, brought in by EMS as a code stroke due to acute onset of the above stated symptoms. Initial evaluation took place at [facility] and transferred to [facility] for further management.  Never had similar symptoms before.  He indicated that he was at work when around 4 am developed sudden onset of numbness from the right elbow down to the entire forearm-hand which was associated with inability to use the right hand. Had a " tight" sensation in the right upper abdomen. Stated that those symptoms rapidly improved and went away approximately 1 hour after onset.  There was not associated weakness-numbness of the right face or right LE, no HA, vertigo, double vision, difficulty swallowing, slurred speech, imbalance, language or visual disturbances.  Upon arrival to [facility] had NIHSS 0.  CT brain showed no acute abnormality.  Date last known well: 08/23/13  Time last known well: 4 am  tPA Given: no, symptoms resolved  NIHSS: 0  MRS: 0   . He was admitted to the neuro floor bed for further evaluation and treatment.  SUBJECTIVE His wife is at the bedside.  Overall he feels his condition is rapidly improving. No new complaints  OBJECTIVE Most recent Vital Signs: Filed Vitals:   08/25/13 0040 08/25/13 0441 08/25/13 0819 08/25/13 1106  BP: 131/90 131/74 144/81 120/57  Pulse: 72 66 80 75  Temp: 98.4 F (36.9 C) 98.5 F (36.9 C) 98.5 F (36.9 C) 98.3 F (36.8 C)  TempSrc: Oral Oral Oral Oral  Resp: 18 16 18 16   Height:      Weight:  174 lb 8 oz (79.153 kg)    SpO2: 100% 100% 96% 99%   CBG (last 3)   Recent Labs  08/24/13 2041 08/25/13 0749 08/25/13 1139  GLUCAP 128* 108* 110*    IV Fluid Intake:    MEDICATIONS   PRN:      Diet:      Activity:  Bedrest  DVT Prophylaxis:  lovenox  CLINICALLY SIGNIFICANT STUDIES Basic Metabolic  Panel:   Recent Labs Lab 08/23/13 0445 08/23/13 1204  NA 141  --   K 4.1  --   CL 102  --   CO2 20  --   GLUCOSE 148*  --   BUN 8  --   CREATININE 0.80 0.70  CALCIUM 9.3  --    Liver Function Tests:   Recent Labs Lab 08/23/13 0445  AST 31  ALT 17  ALKPHOS 96  BILITOT 0.4  PROT 8.5*  ALBUMIN 3.9   CBC:   Recent Labs Lab 08/23/13 0445 08/23/13 1204  WBC 11.7* 13.5*  NEUTROABS 8.1*  --   HGB 9.3* 8.5*  HCT 31.6* 29.9*  MCV 74.0* 72.4*  PLT 367 299   Coagulation:   Recent Labs Lab 08/23/13 0445  LABPROT 13.8  INR 1.08   Cardiac Enzymes: No results found for this basename: CKTOTAL, CKMB, CKMBINDEX, TROPONINI,  in the last 168 hours Urinalysis:   Recent Labs Lab 08/23/13 0710  COLORURINE YELLOW  LABSPEC 1.019  PHURINE 6.0  GLUCOSEU NEGATIVE  HGBUR NEGATIVE  BILIRUBINUR NEGATIVE  KETONESUR NEGATIVE  PROTEINUR NEGATIVE  UROBILINOGEN 1.0  NITRITE NEGATIVE  LEUKOCYTESUR NEGATIVE   Lipid Panel    Component Value Date/Time   CHOL 119 08/23/2013 2351   TRIG 184* 08/23/2013 2351   HDL 26* 08/23/2013 2351  CHOLHDL 4.6 08/23/2013 2351   VLDL 37 08/23/2013 2351   LDLCALC 56 08/23/2013 2351   HgbA1C  Lab Results  Component Value Date   HGBA1C 5.9* 08/23/2013    Urine Drug Screen:     Component Value Date/Time   LABOPIA POSITIVE* 08/25/2013 0021   COCAINSCRNUR NONE DETECTED 08/25/2013 0021   LABBENZ POSITIVE* 08/25/2013 0021   AMPHETMU NONE DETECTED 08/25/2013 0021   THCU NONE DETECTED 08/25/2013 0021   LABBARB NONE DETECTED 08/25/2013 0021    Alcohol Level:   Recent Labs Lab 08/23/13 0445  ETH <11    No results found.    2D Echocardiogram  Left ventricle: The cavity size was normal. Systolic function was normal. The estimated ejection fraction was in the range of 55% to 60%. Wall motion was normal; there were no regional wall motion abnormalities. Doppler parameters are consistent with abnormal left ventricular relaxation (grade 1 diastolic  dysfunction).    TEE  Left ventricle: Systolic function was normal. The estimated ejection fraction was in the range of 60% to 65%. Wall motion was normal; there were no regional wall motion abnormalities. - Left atrium: No evidence of thrombus in the atrial cavity or appendage. - Right atrium: No evidence of thrombus in the atrial cavity or appendage.     Carotid Doppler  Bilateral: 1-39% ICA stenosis. Vertebral artery flow is antegrade.    CXR    EKG  23-Aug-2013 04:43:32 [place] [facility]-HPED ROUTINE RECORD Normal sinus rhythm Normal ECG Therapy Recommendations  pending Physical Exam   middle aged [place] african male not in distress.Awake alert. Afebrile. Head is nontraumatic. Neck is supple without bruit. Hearing is normal. Cardiac exam no murmur or gallop. Lungs are clear to auscultation. Distal pulses are well felt. Neurological Exam ;  Awake  Alert oriented x 3. Normal speech and language.eye movements full without nystagmus.fundi were not visualized. Vision acuity and fields appear normal. Hearing is normal. Palatal movements are normal. Face symmetric. Tongue midline. Normal strength, tone, reflexes and coordination. Normal sensation. Gait deferred. ASSESSMENT Mr. Kirk Carson is a 63 y.o. male presenting with sudden onset of transient right upper extremity weakness and numbness which has now resolved. 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 Infarct felt to be  embolic secondary to unidentified source-likely crytogenic  On no prior to admission. Now on aspirin 81 mg orally every day for secondary stroke prevention. Patient with resultant  No deficits. Stroke work up underway.      LDL 56 mg%      Hospital day # 2  TREATMENT/PLAN  Continue   aspirin 81 mg orally every day for secondary stroke prevention.  [place] home today. Outpatient 30 day cardiac monitor for paroxysmal atrial fibrillation  Follow up in stroke clinic with  me in 2 months    Antony Contras, MD 08/25/2013 6:08 PM     To contact Stroke Continuity provider, please refer to [email or web address] After hours, contact General Neurology

## 2013-08-25 NOTE — Discharge Summary (Addendum)
Physician Discharge Summary  Kirk Carson [id number] DOB: Jun 03, 1950 DOA: 08/23/2013  PCP: Pcp Not In System  Admit date: 08/23/2013 Discharge date: 08/25/2013  Time spent: >35 minutes  Recommendations for Outpatient Follow-up:  F/u with PCP in 1-2 weeks  F/u with neurologist in 4 week s  Discharge Diagnoses:  Principal Problem:   TIA (transient ischemic attack) Active Problems:   Hypertension   Discharge Condition: stable   Diet recommendation: low sodium   Filed Weights   08/23/13 0442 08/24/13 0527 08/25/13 0441  Weight: 77.111 kg (170 lb) 79.379 kg (175 lb) 79.153 kg (174 lb 8 oz)    History of present illness:  63 y.o. Chronic tobacco use history who was brought to the emergency room by EMS this morning with complaints of right upper extremity numbness and tingling found to have acute CVA   Hospital Course:  1. Acute CVA; MRI: Several small, acute infarcts in the bilateral frontal lobes and right parietal and occipital lobes, suggestive of emboli.TEE. Doppler US carotids are unremarkable  -symptoms resolved; cont ASA; called cardiology to arrange OP 30 day cardiac monitor   2. Cough chronic, tobacco use; mild leukocytosis but afebrile; no SOB, no chest pain  -strongly reccommended to stop smoking; inhaler prn;  3. Anemia chronic, no acute s/s fo bleeding; denies abdominal symptoms;  -started on PO iron supplements; recommended colonoscopy as outpatient to be scheduled;  -d/w patient, his wife they agreed to f/u with PCP in 1 week 4. HTN started amlodipine; OP titration as needed   Procedures:  MRI (i.e. Studies not automatically included, echos, thoracentesis, etc; not x-rays)  TEE: Study Conclusions  - Left ventricle: Systolic function was normal. The estimated ejection fraction was in the range of 60% to 65%. Wall motion was normal; there were no regional wall motion abnormalities. - Left atrium: No evidence of thrombus in the atrial cavity or  appendage. - Right atrium: No evidence of thrombus in the atrial cavity or appendage. Impressions:  - No cardiac source of emboli was indentified. Transesophageal echocardiography. 2D and color Doppler.   Consultations:  Neurology   Discharge Exam: Filed Vitals:   08/25/13 1106  BP: 120/57  Pulse: 75  Temp: 98.3 F (36.8 C)  Resp: 16    General: alert Cardiovascular: s1,s2 rrr Respiratory: CTA BL  Discharge Instructions  Discharge Orders   Future Appointments Provider Department Dept Phone   08/31/2013 12:00 PM Chari Manning, NP [place] [phone]   Future Orders Complete By Expires   Diet - low sodium heart healthy  As directed    Discharge instructions  As directed    Increase activity slowly  As directed        Medication List         acetaminophen 325 MG tablet  Commonly known as:  TYLENOL  Take 650 mg by mouth every 6 (six) hours as needed for mild pain.     amLODipine 5 MG tablet  Commonly known as:  NORVASC  Take 1 tablet (5 mg total) by mouth daily.     aspirin 81 MG EC tablet  Take 1 tablet (81 mg total) by mouth daily.     ferrous sulfate 325 (65 FE) MG tablet  Commonly known as:  FERROUSUL  Take 1 tablet (325 mg total) by mouth 3 (three) times daily with meals.     simvastatin 40 MG tablet  Commonly known as:  ZOCOR  Take 1 tablet (40 mg total) by mouth daily.  Allergies  Allergen Reactions  . Asa [Aspirin] Nausea And Vomiting       Follow-up Information   Follow up with [facility]     On 08/31/2013. [facility] Follow up and Primary Care Provider @ 12:00 with Chari Manning NP)    Contact information:   [place] [id number] [phone]       The results of significant diagnostics from this hospitalization (including imaging, microbiology, ancillary and laboratory) are listed below for reference.    Significant Diagnostic Studies: Dg  Chest 2 View  08/23/2013   CLINICAL DATA:  Cough, smoking history  EXAM: CHEST  2 VIEW  COMPARISON:  None.  FINDINGS: Heart size and vascular pattern are normal. There is moderate interstitial pattern in the lower 2/3 of both lungs with no consolidation effusion or pneumothorax.  IMPRESSION: Bilateral interstitial change of on certain chronicity in the absence of prior studies. This may represent chronic interstitial lung disease perhaps related to smoking history. Given the well-defined reticular nature of the process, acute atypical pneumonia is considered unlikely.   Electronically Signed   By: Skipper Cliche M.D.   On: 08/23/2013 14:25   Ct Head Wo Contrast  08/23/2013   CLINICAL DATA:  Code stroke. Right arm pain and weakness onset 45 min ago.  EXAM: CT HEAD WITHOUT CONTRAST  TECHNIQUE: Contiguous axial images were obtained from the base of the skull through the vertex without intravenous contrast.  COMPARISON:  None.  FINDINGS: Mild cerebral atrophy and white matter changes. No ventricular dilatation. No mass effect or midline shift. No abnormal extra-axial fluid collections. Gray-white matter junctions are distinct. Basal cisterns are not effaced. No evidence of acute intracranial hemorrhage. No depressed skull fractures. Visualized paranasal sinuses and mastoid air cells are not opacified.  IMPRESSION: No acute intracranial abnormalities.  Code stroke results called to Dr. Lita Mains at [facility] hr on 08/23/2013.   Electronically Signed   By: Lucienne Capers M.D.   On: 08/23/2013 05:03   Mr Jodene Nam Head Wo Contrast  08/23/2013   CLINICAL DATA:  Right forearm and hand numbness and weakness. Stroke.  EXAM: MRI HEAD WITHOUT CONTRAST  MRA HEAD WITHOUT CONTRAST  TECHNIQUE: Multiplanar, multiecho pulse sequences of the brain and surrounding structures were obtained without intravenous contrast. Angiographic images of the head were obtained using MRA technique without contrast.  COMPARISON:  Head CT 08/23/2013   FINDINGS: MRI HEAD FINDINGS  There are several subcentimeter foci of restricted diffusion involving cortex and subcortical white matter in the frontal lobes as well as right parietal and right occipital lobes, consistent with acute infarcts. A remote, punctate infarct is noted in the superior right cerebellum. Scattered, small foci of T2 hyperintensity in the deep cerebral white matter and pons are nonspecific but compatible with minimal chronic small vessel ischemic disease. Mild age-related cerebral atrophy is present. There is no evidence of intracranial hemorrhage, mass, midline shift, or extra-axial fluid collection.  Orbits are unremarkable. Paranasal sinuses and mastoid air cells are clear. Major intracranial vascular flow voids are preserved.  MRA HEAD FINDINGS  Images are mildly degraded by motion.  The visualized distal vertebral arteries are patent and codominant. Left PICA origin is patent. Right PICA origin also appears grossly patent but is not well evaluated. SCA origins are patent. Basilar artery is patent without stenosis. PCAs are unremarkable. Posterior communicating arteries are not clearly identified.  Internal carotid arteries are patent from skullbase to carotid termini. Left A1 segment is hypoplastic,  with the left A2 predominantly supplied via the anterior communicating artery. ACAs and MCAs are otherwise unremarkable.  IMPRESSION: 1. Several small, acute infarcts in the bilateral frontal lobes and right parietal and occipital lobes, suggestive of emboli. 2. Normal variant MRA as above without evidence of major intracranial arterial occlusion or significant proximal stenosis.   Electronically Signed   By: Logan Bores   On: 08/23/2013 09:26   Mr Brain Wo Contrast  08/23/2013   CLINICAL DATA:  Right forearm and hand numbness and weakness. Stroke.  EXAM: MRI HEAD WITHOUT CONTRAST  MRA HEAD WITHOUT CONTRAST  TECHNIQUE: Multiplanar, multiecho pulse sequences of the brain and surrounding  structures were obtained without intravenous contrast. Angiographic images of the head were obtained using MRA technique without contrast.  COMPARISON:  Head CT 08/23/2013  FINDINGS: MRI HEAD FINDINGS  There are several subcentimeter foci of restricted diffusion involving cortex and subcortical white matter in the frontal lobes as well as right parietal and right occipital lobes, consistent with acute infarcts. A remote, punctate infarct is noted in the superior right cerebellum. Scattered, small foci of T2 hyperintensity in the deep cerebral white matter and pons are nonspecific but compatible with minimal chronic small vessel ischemic disease. Mild age-related cerebral atrophy is present. There is no evidence of intracranial hemorrhage, mass, midline shift, or extra-axial fluid collection.  Orbits are unremarkable. Paranasal sinuses and mastoid air cells are clear. Major intracranial vascular flow voids are preserved.  MRA HEAD FINDINGS  Images are mildly degraded by motion.  The visualized distal vertebral arteries are patent and codominant. Left PICA origin is patent. Right PICA origin also appears grossly patent but is not well evaluated. SCA origins are patent. Basilar artery is patent without stenosis. PCAs are unremarkable. Posterior communicating arteries are not clearly identified.  Internal carotid arteries are patent from skullbase to carotid termini. Left A1 segment is hypoplastic, with the left A2 predominantly supplied via the anterior communicating artery. ACAs and MCAs are otherwise unremarkable.  IMPRESSION: 1. Several small, acute infarcts in the bilateral frontal lobes and right parietal and occipital lobes, suggestive of emboli. 2. Normal variant MRA as above without evidence of major intracranial arterial occlusion or significant proximal stenosis.   Electronically Signed   By: Logan Bores   On: 08/23/2013 09:26    Microbiology: No results found for this or any previous visit (from the  past 240 hour(s)).   Labs: Basic Metabolic Panel:  Recent Labs Lab 08/23/13 0445 08/23/13 1204  NA 141  --   K 4.1  --   CL 102  --   CO2 20  --   GLUCOSE 148*  --   BUN 8  --   CREATININE 0.80 0.70  CALCIUM 9.3  --    Liver Function Tests:  Recent Labs Lab 08/23/13 0445  AST 31  ALT 17  ALKPHOS 96  BILITOT 0.4  PROT 8.5*  ALBUMIN 3.9   No results found for this basename: LIPASE, AMYLASE,  in the last 168 hours No results found for this basename: AMMONIA,  in the last 168 hours CBC:  Recent Labs Lab 08/23/13 0445 08/23/13 1204  WBC 11.7* 13.5*  NEUTROABS 8.1*  --   HGB 9.3* 8.5*  HCT 31.6* 29.9*  MCV 74.0* 72.4*  PLT 367 299   Cardiac Enzymes: No results found for this basename: CKTOTAL, CKMB, CKMBINDEX, TROPONINI,  in the last 168 hours BNP: BNP (last 3 results) No results found for this basename: PROBNP,  in  the last 8760 hours CBG:  Recent Labs Lab 08/24/13 0734 08/24/13 1131 08/24/13 1638 08/24/13 2041 08/25/13 0749  GLUCAP 93 101* 111* 128* 108*       Signed:  Arie Sabina Shawntel Farnworth  [facility] 08/25/2013, 11:39 AM

## 2013-08-31 ENCOUNTER — Ambulatory Visit: Payer: 59 | Attending: Internal Medicine | Admitting: Internal Medicine

## 2013-08-31 ENCOUNTER — Encounter: Payer: Self-pay | Admitting: Internal Medicine

## 2013-08-31 VITALS — BP 106/71 | HR 82 | Temp 98.6°F | Resp 20 | Ht 65.0 in | Wt 182.6 lb

## 2013-08-31 DIAGNOSIS — Z7982 Long term (current) use of aspirin: Secondary | ICD-10-CM | POA: Insufficient documentation

## 2013-08-31 DIAGNOSIS — D509 Iron deficiency anemia, unspecified: Secondary | ICD-10-CM | POA: Insufficient documentation

## 2013-08-31 DIAGNOSIS — E785 Hyperlipidemia, unspecified: Secondary | ICD-10-CM | POA: Insufficient documentation

## 2013-08-31 DIAGNOSIS — K5900 Constipation, unspecified: Secondary | ICD-10-CM | POA: Insufficient documentation

## 2013-08-31 DIAGNOSIS — Z8673 Personal history of transient ischemic attack (TIA), and cerebral infarction without residual deficits: Secondary | ICD-10-CM | POA: Insufficient documentation

## 2013-08-31 DIAGNOSIS — I10 Essential (primary) hypertension: Secondary | ICD-10-CM | POA: Insufficient documentation

## 2013-08-31 DIAGNOSIS — F17200 Nicotine dependence, unspecified, uncomplicated: Secondary | ICD-10-CM | POA: Insufficient documentation

## 2013-08-31 MED ORDER — POLYETHYLENE GLYCOL 3350 17 GM/SCOOP PO POWD: 17.0000 g | Freq: Every day | ORAL | Status: DC

## 2013-08-31 NOTE — Progress Notes (Signed)
Patient ID: Kirk Carson, male   DOB: [birth year], 63 y.o.   MRN: [id number]   Kirk Carson, is a 63 y.o. male  HQI:[id number]  XLK:[id number]  DOB - Apr 10, 1951  CC:  Chief Complaint  Patient presents with  . Hospitalization Follow-up  . Establish Care       HPI: Kirk Carson is a 63 y.o. male here today to establish medical care.  Patient reports that he was recently discharged from the hospital for a TIA.  He reports that all symptoms have resolved since hospital admission.  He reports that he is not ready to quit smoking but he is considering it.  Patient reports good compliance with aspirin therapy and other medications.    Patient has No headache, No chest pain, No abdominal pain - No Nausea, No new weakness tingling or numbness, No Cough - SOB.  Allergies  Allergen Reactions  . Asa [Aspirin] Nausea And Vomiting   Past Medical History  Diagnosis Date  . Hyperlipidemia   . Hypertension    Current Outpatient Prescriptions on File Prior to Visit  Medication Sig Dispense Refill  . acetaminophen (TYLENOL) 325 MG tablet Take 650 mg by mouth every 6 (six) hours as needed for mild pain.      Marland Kitchen amLODipine (NORVASC) 5 MG tablet Take 1 tablet (5 mg total) by mouth daily.  30 tablet  1  . aspirin EC 81 MG EC tablet Take 1 tablet (81 mg total) by mouth daily.      . ferrous sulfate (FERROUSUL) 325 (65 FE) MG tablet Take 1 tablet (325 mg total) by mouth 3 (three) times daily with meals.  90 tablet  3  . simvastatin (ZOCOR) 40 MG tablet Take 1 tablet (40 mg total) by mouth daily.  30 tablet  2   No current facility-administered medications on file prior to visit.   Family History  Problem Relation Age of Onset  . Birth defects Mother    History   Social History  . Marital Status: Married    Spouse Name: N/A    Number of Children: N/A  . Years of Education: N/A   Occupational History  . Not on file.   Social History Main Topics  . Smoking status: Current Every Day  Smoker -- 1.00 packs/day  . Smokeless tobacco: Not on file  . Alcohol Use: Yes     Comment: occasionally  . Drug Use: No  . Sexual Activity: Not on file   Other Topics Concern  . Not on file   Social History Narrative  . No narrative on file   Review of Systems  Constitutional: Negative for fever and chills.  Eyes: Negative.   Respiratory: Negative.   Cardiovascular: Negative.   Gastrointestinal: Negative.  Negative for blood in stool.  Genitourinary: Negative.   Musculoskeletal: Positive for back pain (intermittently).  Skin: Negative.   Neurological: Positive for headaches (occasionally). Negative for dizziness, tingling and sensory change.  Psychiatric/Behavioral: Negative.      Objective:   Filed Vitals:   08/31/13 1152  BP: 106/71  Pulse: 82  Temp: 98.6 F (37 C)  Resp: 20    Physical Exam: Constitutional: Patient appears well-developed and well-nourished. No distress. HENT: Normocephalic, atraumatic, External right and left ear normal. Oropharynx is clear and moist.  Eyes: Conjunctivae and EOM are normal. PERRLA, no scleral icterus. Neck: Normal ROM. Neck supple. No JVD. No tracheal deviation. No thyromegaly. CVS: RRR, S1/S2 +, no murmurs, no gallops, no carotid bruit.  Pulmonary: Effort  and breath sounds normal, no stridor, rhonchi, wheezes, rales.  Abdominal: Soft. BS +, no distension, tenderness, rebound or guarding.  Musculoskeletal: Normal range of motion. No edema and no tenderness.  Lymphadenopathy: No lymphadenopathy noted, cervical Neuro: Alert. Normal reflexes, muscle tone coordination. No cranial nerve deficit. Skin: Skin is warm and dry. No rash noted. Not diaphoretic. No erythema. No pallor. Psychiatric: Normal mood and affect. Behavior, judgment, thought content normal.  Lab Results  Component Value Date   WBC 13.5* 08/23/2013   HGB 8.5* 08/23/2013   HCT 29.9* 08/23/2013   MCV 72.4* 08/23/2013   PLT 299 08/23/2013   Lab Results  Component  Value Date   CREATININE 0.70 08/23/2013   BUN 8 08/23/2013   NA 141 08/23/2013   K 4.1 08/23/2013   CL 102 08/23/2013   CO2 20 08/23/2013    Lab Results  Component Value Date   HGBA1C 5.9* 08/23/2013   Lipid Panel     Component Value Date/Time   CHOL 119 08/23/2013 2351   TRIG 184* 08/23/2013 2351   HDL 26* 08/23/2013 2351   CHOLHDL 4.6 08/23/2013 2351   VLDL 37 08/23/2013 2351   LDLCALC 56 08/23/2013 2351       Assessment and plan:  Kirk Carson was seen today for hospitalization follow-up and establish care.  Diagnoses and associated orders for this visit:  History of TIA (transient ischemic attack) - Ambulatory referral to Neurology. Needs followup visit for recent TIA. Stressed the importance of continued aspirin therapy.  Tobacco use disorder Stress the importance of smoke cessation related to health. Will continue to discuss on future visits. Unspecified constipation - polyethylene glycol powder (GLYCOLAX/MIRALAX) powder; Take 17 g by mouth daily.  Iron deficiency anemia - Ambulatory referral to Gastroenterology. Colonoscopy needed to evaluate reasons for anemia.  Return in about 3 months (around 12/01/2013) for anemia, past TIA.  The patient was given clear instructions to go to ER or return to medical center if symptoms don't improve, worsen or new problems develop. The patient verbalized understanding. The patient was told to call to get lab results if they haven't heard anything in the next week.     Chari Manning, NP-C [facility] [phone] 08/31/2013, 12:02 PM

## 2013-08-31 NOTE — Progress Notes (Signed)
Patient is here following hospitalization for symptoms of stroke.

## 2013-09-08 ENCOUNTER — Encounter: Payer: Self-pay | Admitting: Internal Medicine

## 2013-09-19 ENCOUNTER — Encounter: Payer: Self-pay | Admitting: Neurology

## 2013-10-07 ENCOUNTER — Encounter: Payer: 59 | Admitting: *Deleted

## 2013-10-13 ENCOUNTER — Ambulatory Visit (AMBULATORY_SURGERY_CENTER): Payer: Self-pay | Admitting: *Deleted

## 2013-10-13 ENCOUNTER — Encounter: Payer: Self-pay | Admitting: Internal Medicine

## 2013-10-13 VITALS — Ht 67.0 in | Wt 182.0 lb

## 2013-10-13 DIAGNOSIS — Z1211 Encounter for screening for malignant neoplasm of colon: Secondary | ICD-10-CM

## 2013-10-13 MED ORDER — MOVIPREP 100 G PO SOLR: 1.0000 | Freq: Once | ORAL | Status: DC

## 2013-10-13 NOTE — Progress Notes (Signed)
No egg or soy allergy. No anesthesia problems.  No home O2.  No diet meds.  

## 2013-10-13 NOTE — Progress Notes (Signed)
Pt has hx of TIA 08/23/13, spoke with Holland Falling CRNA, pt can not have colonoscopy at [facility] if pt has had CVA within 3 months, pt is not 3 months out from dx, [facility] agreed pt is for screening to reschedule pt out further-adm  Called pt and advised of findings, pt states understanding of why we need to wait, pt is screening colon denies having any symptoms, pt agrees to wait, scheduled pt for 12/22/13 previsit and 01/04/14 colonoscopy.-adm

## 2013-10-19 ENCOUNTER — Encounter: Payer: 59 | Admitting: Internal Medicine

## 2013-10-26 ENCOUNTER — Encounter (INDEPENDENT_AMBULATORY_CARE_PROVIDER_SITE_OTHER): Payer: Self-pay

## 2013-10-26 ENCOUNTER — Encounter: Payer: Self-pay | Admitting: Neurology

## 2013-10-26 ENCOUNTER — Ambulatory Visit (INDEPENDENT_AMBULATORY_CARE_PROVIDER_SITE_OTHER): Payer: 59 | Admitting: Neurology

## 2013-10-26 VITALS — BP 121/69 | HR 78 | Ht 65.0 in | Wt 178.6 lb

## 2013-10-26 DIAGNOSIS — I639 Cerebral infarction, unspecified: Secondary | ICD-10-CM

## 2013-10-26 DIAGNOSIS — I6350 Cerebral infarction due to unspecified occlusion or stenosis of unspecified cerebral artery: Secondary | ICD-10-CM

## 2013-10-26 MED ORDER — ASPIRIN EC 325 MG PO TBEC: 325.0000 mg | DELAYED_RELEASE_TABLET | Freq: Every day | ORAL | Status: DC

## 2013-10-26 NOTE — Patient Instructions (Signed)
1. Increase ASA from 81mg  to 325mg  dialy 2. Continue the zocor 40mg  daily 3. Will arrange for transcranial doppler 4. Will contact cardiology to arrange for loop recorder since pt can not have cardionet due to occupation 5. Will check CBC, LFT and Ck level 6. Follow up with PCP next month. 7. Follow up with me in 2 months.

## 2013-10-26 NOTE — Progress Notes (Signed)
STROKE NEUROLOGY FOLLOW UP NOTE  NAME: Kirk Carson DOB: 1951/03/13  REASON FOR VISIT: stroke follow up HISTORY FROM: patient and chart  Today we had the pleasure of seeing Kirk Carson in follow-up at our Neurology Clinic. Pt was accompanied by no one.   History Summary Kirk Carson is an 63 y.o. male, right handed, with no significant past medical history, smoker, brought in by EMS as a code stroke due to acute onset numbness and pain at right elbow down to the entire forearm and hand as well as weakness of right hand on 08/23/13. Never had similar symptoms before. Those symptoms rapidly improved and went away approximately 55min after onset. Upon arrival to [facility] had NIHSS 0. CT brain showed no acute abnormality. Later MRI showed multiple punctate infarcts involving both sides. Stroke work up done (see blow). He was discharged with ASA 81 mg and zocor 40mg . Also amlodipine 5mg  for BP. Request cardionet as out pt.  Interval History  During the interval time, the patient has been doing well but he can not have cardionet due to his occupation for which he has to constant push object against his chest. He stated that his BP was good and today his BP was 121/69.  The following represents the patient's updated allergies and side effects list: Allergies  Allergen Reactions  . Asa [Aspirin] Nausea And Vomiting    Labs since last visit of relevance include the following: Results for orders placed during the hospital encounter of 08/23/13  ETHANOL      Result Value Ref Range   Alcohol, Ethyl (B) <11  0 - 11 mg/dL  PROTIME-INR      Result Value Ref Range   Prothrombin Time 13.8  11.6 - 15.2 seconds   INR 1.08  0.00 - 1.49  APTT      Result Value Ref Range   aPTT 25  24 - 37 seconds  CBC      Result Value Ref Range   WBC 11.7 (*) 4.0 - 10.5 K/uL   RBC 4.27  4.22 - 5.81 MIL/uL   Hemoglobin 9.3 (*) 13.0 - 17.0 g/dL   HCT 31.6 (*) 39.0 - 52.0 %   MCV 74.0 (*) 78.0 - 100.0 fL   MCH 21.8 (*) 26.0 - 34.0 pg   MCHC 29.4 (*) 30.0 - 36.0 g/dL   RDW 18.5 (*) 11.5 - 15.5 %   Platelets 367  150 - 400 K/uL  DIFFERENTIAL      Result Value Ref Range   Neutrophils Relative % 69  43 - 77 %   Neutro Abs 8.1 (*) 1.7 - 7.7 K/uL   Lymphocytes Relative 20  12 - 46 %   Lymphs Abs 2.4  0.7 - 4.0 K/uL   Monocytes Relative 9  3 - 12 %   Monocytes Absolute 1.0  0.1 - 1.0 K/uL   Eosinophils Relative 1  0 - 5 %   Eosinophils Absolute 0.1  0.0 - 0.7 K/uL   Basophils Relative 0  0 - 1 %   Basophils Absolute 0.1  0.0 - 0.1 K/uL   WBC Morphology WHITE COUNT CONFIRMED ON SMEAR     RBC Morphology STOMATOCYTES     Smear Review PLATELET COUNT CONFIRMED BY SMEAR    COMPREHENSIVE METABOLIC PANEL      Result Value Ref Range   Sodium 141  137 - 147 mEq/L   Potassium 4.1  3.7 - 5.3 mEq/L   Chloride 102  96 - 112  mEq/L   CO2 20  19 - 32 mEq/L   Glucose, Bld 148 (*) 70 - 99 mg/dL   BUN 8  6 - 23 mg/dL   Creatinine, Ser 0.80  0.50 - 1.35 mg/dL   Calcium 9.3  8.4 - 10.5 mg/dL   Total Protein 8.5 (*) 6.0 - 8.3 g/dL   Albumin 3.9  3.5 - 5.2 g/dL   AST 31  0 - 37 U/L   ALT 17  0 - 53 U/L   Alkaline Phosphatase 96  39 - 117 U/L   Total Bilirubin 0.4  0.3 - 1.2 mg/dL   GFR calc non Af Amer >90  >90 mL/min   GFR calc Af Amer >90  >90 mL/min  URINE RAPID DRUG SCREEN (HOSP PERFORMED)      Result Value Ref Range   Opiates NONE DETECTED  NONE DETECTED   Cocaine NONE DETECTED  NONE DETECTED   Benzodiazepines NONE DETECTED  NONE DETECTED   Amphetamines NONE DETECTED  NONE DETECTED   Tetrahydrocannabinol NONE DETECTED  NONE DETECTED   Barbiturates NONE DETECTED  NONE DETECTED  URINALYSIS, ROUTINE W REFLEX MICROSCOPIC      Result Value Ref Range   Color, Urine YELLOW  YELLOW   APPearance CLEAR  CLEAR   Specific Gravity, Urine 1.019  1.005 - 1.030   pH 6.0  5.0 - 8.0   Glucose, UA NEGATIVE  NEGATIVE mg/dL   Hgb urine dipstick NEGATIVE  NEGATIVE   Bilirubin Urine NEGATIVE  NEGATIVE   Ketones,  ur NEGATIVE  NEGATIVE mg/dL   Protein, ur NEGATIVE  NEGATIVE mg/dL   Urobilinogen, UA 1.0  0.0 - 1.0 mg/dL   Nitrite NEGATIVE  NEGATIVE   Leukocytes, UA NEGATIVE  NEGATIVE  HEMOGLOBIN A1C      Result Value Ref Range   Hemoglobin A1C 5.8 (*) <5.7 %   Mean Plasma Glucose 120 (*) <117 mg/dL  HEMOGLOBIN A1C      Result Value Ref Range   Hemoglobin A1C 5.9 (*) <5.7 %   Mean Plasma Glucose 123 (*) <117 mg/dL  URINE RAPID DRUG SCREEN (HOSP PERFORMED)      Result Value Ref Range   Opiates POSITIVE (*) NONE DETECTED   Cocaine NONE DETECTED  NONE DETECTED   Benzodiazepines POSITIVE (*) NONE DETECTED   Amphetamines NONE DETECTED  NONE DETECTED   Tetrahydrocannabinol NONE DETECTED  NONE DETECTED   Barbiturates NONE DETECTED  NONE DETECTED  CBC      Result Value Ref Range   WBC 13.5 (*) 4.0 - 10.5 K/uL   RBC 4.13 (*) 4.22 - 5.81 MIL/uL   Hemoglobin 8.5 (*) 13.0 - 17.0 g/dL   HCT 29.9 (*) 39.0 - 52.0 %   MCV 72.4 (*) 78.0 - 100.0 fL   MCH 20.6 (*) 26.0 - 34.0 pg   MCHC 28.4 (*) 30.0 - 36.0 g/dL   RDW 17.9 (*) 11.5 - 15.5 %   Platelets 299  150 - 400 K/uL  CREATININE, SERUM      Result Value Ref Range   Creatinine, Ser 0.70  0.50 - 1.35 mg/dL   GFR calc non Af Amer >90  >90 mL/min   GFR calc Af Amer >90  >90 mL/min  LIPID PANEL      Result Value Ref Range   Cholesterol 119  0 - 200 mg/dL   Triglycerides 184 (*) <150 mg/dL   HDL 26 (*) >39 mg/dL   Total CHOL/HDL Ratio 4.6     VLDL  37  0 - 40 mg/dL   LDL Cholesterol 56  0 - 99 mg/dL  GLUCOSE, CAPILLARY      Result Value Ref Range   Glucose-Capillary 110 (*) 70 - 99 mg/dL   Comment 1 Documented in Chart     Comment 2 Notify RN    GLUCOSE, CAPILLARY      Result Value Ref Range   Glucose-Capillary 137 (*) 70 - 99 mg/dL   Comment 1 Documented in Chart     Comment 2 Notify RN    GLUCOSE, CAPILLARY      Result Value Ref Range   Glucose-Capillary 115 (*) 70 - 99 mg/dL  GLUCOSE, CAPILLARY      Result Value Ref Range    Glucose-Capillary 93  70 - 99 mg/dL   Comment 1 Documented in Chart     Comment 2 Notify RN    FERRITIN      Result Value Ref Range   Ferritin 9 (*) 22 - 322 ng/mL  IRON AND TIBC      Result Value Ref Range   Iron 32 (*) 42 - 135 ug/dL   TIBC 459 (*) 215 - 435 ug/dL   Saturation Ratios 7 (*) 20 - 55 %   UIBC 427 (*) 125 - 400 ug/dL  GLUCOSE, CAPILLARY      Result Value Ref Range   Glucose-Capillary 101 (*) 70 - 99 mg/dL   Comment 1 Documented in Chart     Comment 2 Notify RN    GLUCOSE, CAPILLARY      Result Value Ref Range   Glucose-Capillary 111 (*) 70 - 99 mg/dL  GLUCOSE, CAPILLARY      Result Value Ref Range   Glucose-Capillary 128 (*) 70 - 99 mg/dL  GLUCOSE, CAPILLARY      Result Value Ref Range   Glucose-Capillary 108 (*) 70 - 99 mg/dL  GLUCOSE, CAPILLARY      Result Value Ref Range   Glucose-Capillary 110 (*) 70 - 99 mg/dL    The neurologically relevant items on the patient's problem list were reviewed on today's visit.  Neurologic Examination  A problem focused neurological exam (12 or more points of the single system neurologic examination, vital signs counts as 1 point, cranial nerves count for 8 points) was performed.  Blood pressure 121/69, pulse 78, height 5\' 5"  (1.651 m), weight 178 lb 9.6 oz (81.012 kg).  General - Well nourished, well developed, in no apparent distress.  Ophthalmologic - Sharp disc margins OU.  Cardiovascular - Regular rate and rhythm with no murmur.  Mental Status -  Level of arousal and orientation to time, place, and person were intact. Language including expression, naming, repetition, comprehension, reading, and writing was assessed and found intact. Attention span and concentration were normal. Recent and remote memory were intact. Fund of Knowledge was assessed and was intact.  Cranial Nerves II - XII - II - Vision intact OU. III, IV, VI - Extraocular movements intact. V - Facial sensation intact bilaterally. VII - Facial  movement intact bilaterally VIII - Hearing & vestibular intact bilaterally. X - Palate elevates symmetrically. XI - Chin turning & shoulder shrug intact bilaterally. XII - Tongue protrusion intact.  Motor Strength - The patient's strength was normal in all extremities and pronator drift was absent.  Bulk was normal and fasciculations were absent.   Motor Tone - Muscle tone was assessed at the neck and appendages and was normal.  Reflexes - The patient's reflexes were normal in  all extremities and he had no pathological reflexes.  Sensory - Light touch, temperature/pinprick and Romberg testing were assessed and were normal.    Coordination - The patient had normal movements in the hands and feet with no ataxia or dysmetria.  Tremor was absent.  Gait and Station - The patient's transfers, posture, gait, station, and turns were observed as normal.  Data reviewed: I personally reviewed the images and agree with the radiology interpretations.  MRI and MRA 08/23/13 IMPRESSION:  1. Several small, acute infarcts in the bilateral frontal lobes and  right parietal and occipital lobes, suggestive of emboli.  2. Normal variant MRA as above without evidence of major  intracranial arterial occlusion or significant proximal stenosis.  2D Echocardiogram Left ventricle: The cavity size was normal. Systolic function was normal. The estimated ejection fraction was in the range of 55% to 60%. Wall motion was normal; there were no regional wall motion abnormalities. Doppler parameters are consistent with abnormal left ventricular relaxation (grade 1 diastolic dysfunction).  TEE Left ventricle: Systolic function was normal. The estimated ejection fraction was in the range of 60% to 65%. Wall motion was normal; there were no regional wall motion abnormalities. - Left atrium: No evidence of thrombus in the atrial cavity or appendage. - Right atrium: No evidence of thrombus in the atrial cavity or  appendage.  Carotid Doppler Bilateral: 1-39% ICA stenosis. Vertebral artery flow is antegrade.   EKG 23-Aug-2013 04:43:32 [facility] ROUTINE RECORD  Normal sinus rhythm  Normal ECG  A1C 5.9 and LDL 56  Assessment: As you may recall, he is a 63 y.o.  African American male followed up in clinic for stroke. His symptoms in May was quickly resolved and currently no symptoms. He had punctate infarcts involving both sides on MRI raising concerns for embolic stroke. Stroke w/u include TEE, TTE, CUS not revealing. Outpt cardionet not able to do due to his occupational needs. Will do TCD emboli detection and loop recorder for him. In the meantime, increase ASA from 81 to 325mg . He also needs to follow up with PCP regularly.  Diagnoses from this visit: CVA (cerebral vascular accident) - Plan: [place] TRANSCRANIAL DOPPLER EMBOLI MONITORING, CBC with Differential, Hepatic Function Panel, CK, TSH, T4, Free, RPR, Vitamin B12, HIV antibody (with reflex), Ambulatory referral to Cardiac Electrophysiology  Plan:  - increase ASA from 81 to 325 - continue zocor for stroke prevention - TCD emboli detection - loop recorder - follow up with PCP for stroke risk factor modification - RTC in 2 months.  @ORDERS @ Orders Placed This Encounter  Procedures  . [place] TRANSCRANIAL DOPPLER EMBOLI MONITORING  . CBC with Differential  . Hepatic Function Panel  . CK  . TSH  . T4, Free  . RPR  . Vitamin B12  . HIV antibody (with reflex)  . Ambulatory referral to Cardiac Electrophysiology   Patient Instructions  1. Increase ASA from 81mg  to 325mg  dialy 2. Continue the zocor 40mg  daily 3. Will arrange for transcranial doppler 4. Will contact cardiology to arrange for loop recorder since pt can not have cardionet due to occupation 5. Will check CBC, LFT and Ck level 6. Follow up with PCP next month. 7. Follow up with me in 2 months.    Rosalin Hawking, MD PhD [facility] [address], [place], [place] [address] [phone]

## 2013-10-27 ENCOUNTER — Encounter: Payer: 59 | Admitting: Internal Medicine

## 2013-10-27 LAB — CBC WITH DIFFERENTIAL/PLATELET
BASOS ABS: 0 10*3/uL (ref 0.0–0.2)
Basos: 1 %
EOS: 2 %
Eosinophils Absolute: 0.1 10*3/uL (ref 0.0–0.4)
HEMATOCRIT: 43.8 % (ref 37.5–51.0)
Hemoglobin: 14.7 g/dL (ref 12.6–17.7)
IMMATURE GRANS (ABS): 0 10*3/uL (ref 0.0–0.1)
Immature Granulocytes: 0 %
LYMPHS ABS: 1.6 10*3/uL (ref 0.7–3.1)
Lymphs: 25 %
MCH: 29.2 pg (ref 26.6–33.0)
MCHC: 33.6 g/dL (ref 31.5–35.7)
MCV: 87 fL (ref 79–97)
MONOCYTES: 8 %
MONOS ABS: 0.5 10*3/uL (ref 0.1–0.9)
NEUTROS PCT: 64 %
Neutrophils Absolute: 4.2 10*3/uL (ref 1.4–7.0)
RBC: 5.04 x10E6/uL (ref 4.14–5.80)
RDW: 21 % — AB (ref 12.3–15.4)
WBC: 6.5 10*3/uL (ref 3.4–10.8)

## 2013-10-27 LAB — HEPATIC FUNCTION PANEL
ALT: 32 IU/L (ref 0–44)
AST: 27 IU/L (ref 0–40)
Albumin: 4.5 g/dL (ref 3.6–4.8)
Alkaline Phosphatase: 80 IU/L (ref 39–117)
Bilirubin, Direct: 0.11 mg/dL (ref 0.00–0.40)
Total Bilirubin: 0.3 mg/dL (ref 0.0–1.2)
Total Protein: 7.3 g/dL (ref 6.0–8.5)

## 2013-10-27 LAB — HIV ANTIBODY (ROUTINE TESTING W REFLEX)
HIV 1/O/2 Abs-Index Value: <1 (ref <1.00)
HIV-1/HIV-2 Ab: NONREACTIVE

## 2013-10-27 LAB — VITAMIN B12: Vitamin B-12: 316 pg/mL (ref 211–946)

## 2013-10-27 LAB — T4, FREE: Free T4: 1.21 ng/dL (ref 0.82–1.77)

## 2013-10-27 LAB — RPR: RPR: NONREACTIVE

## 2013-10-27 LAB — CK: CK TOTAL: 171 U/L (ref 24–204)

## 2013-10-27 LAB — TSH: TSH: 3.77 u[IU]/mL (ref 0.450–4.500)

## 2013-10-28 ENCOUNTER — Other Ambulatory Visit: Payer: Self-pay | Admitting: Neurology

## 2013-10-28 DIAGNOSIS — I639 Cerebral infarction, unspecified: Secondary | ICD-10-CM

## 2013-10-28 MED ORDER — VITAMIN B-12 1000 MCG PO TABS: 1000.0000 ug | ORAL_TABLET | Freq: Every day | ORAL | Status: DC

## 2013-10-28 NOTE — Progress Notes (Signed)
Quick Note:  Please call pt and tell him that his B12 level is a little low and I have prescribed B12 supplement for him to pick up at pharmacy. His anemia is much better and number is normal now. Thank you. ______

## 2013-10-28 NOTE — Progress Notes (Signed)
Quick Note:  I called and relayed the results of labs. B12 level low and supplement called to pharmacy. ([facility] [address]). Pt verbalized understanding. Relayed that anemia better. ______

## 2013-11-01 ENCOUNTER — Telehealth: Payer: Self-pay | Admitting: Radiology

## 2013-11-01 ENCOUNTER — Other Ambulatory Visit: Payer: 59

## 2013-11-01 NOTE — Telephone Encounter (Signed)
Patient was a no show for Emboli monitoring.

## 2013-11-04 ENCOUNTER — Institutional Professional Consult (permissible substitution): Payer: 59 | Admitting: Internal Medicine

## 2013-11-15 ENCOUNTER — Ambulatory Visit: Payer: Self-pay | Admitting: Neurology

## 2013-12-01 ENCOUNTER — Ambulatory Visit: Payer: 59 | Admitting: Internal Medicine

## 2013-12-08 ENCOUNTER — Ambulatory Visit: Payer: 59 | Attending: Internal Medicine | Admitting: Internal Medicine

## 2013-12-08 VITALS — BP 141/92 | HR 80 | Temp 98.2°F | Resp 16 | Ht 67.0 in | Wt 174.2 lb

## 2013-12-08 DIAGNOSIS — Z8673 Personal history of transient ischemic attack (TIA), and cerebral infarction without residual deficits: Secondary | ICD-10-CM | POA: Insufficient documentation

## 2013-12-08 DIAGNOSIS — Z888 Allergy status to other drugs, medicaments and biological substances status: Secondary | ICD-10-CM | POA: Diagnosis not present

## 2013-12-08 DIAGNOSIS — D509 Iron deficiency anemia, unspecified: Secondary | ICD-10-CM | POA: Insufficient documentation

## 2013-12-08 DIAGNOSIS — E785 Hyperlipidemia, unspecified: Secondary | ICD-10-CM | POA: Insufficient documentation

## 2013-12-08 DIAGNOSIS — R7309 Other abnormal glucose: Secondary | ICD-10-CM | POA: Diagnosis not present

## 2013-12-08 DIAGNOSIS — I10 Essential (primary) hypertension: Secondary | ICD-10-CM | POA: Diagnosis present

## 2013-12-08 DIAGNOSIS — F17200 Nicotine dependence, unspecified, uncomplicated: Secondary | ICD-10-CM | POA: Diagnosis not present

## 2013-12-08 DIAGNOSIS — R739 Hyperglycemia, unspecified: Secondary | ICD-10-CM

## 2013-12-08 DIAGNOSIS — K5900 Constipation, unspecified: Secondary | ICD-10-CM | POA: Diagnosis not present

## 2013-12-08 DIAGNOSIS — D649 Anemia, unspecified: Secondary | ICD-10-CM

## 2013-12-08 DIAGNOSIS — Z7982 Long term (current) use of aspirin: Secondary | ICD-10-CM | POA: Diagnosis not present

## 2013-12-08 LAB — GLUCOSE, POCT (MANUAL RESULT ENTRY): POC Glucose: 100 mg/dl — AB (ref 70–99)

## 2013-12-08 LAB — POCT GLYCOSYLATED HEMOGLOBIN (HGB A1C): Hemoglobin A1C: 5.9

## 2013-12-08 MED ORDER — AMLODIPINE BESYLATE 5 MG PO TABS: 5.0000 mg | ORAL_TABLET | Freq: Every day | ORAL | Status: DC

## 2013-12-08 MED ORDER — ASPIRIN EC 325 MG PO TBEC: 325.0000 mg | DELAYED_RELEASE_TABLET | Freq: Every day | ORAL | Status: DC

## 2013-12-08 MED ORDER — FERROUS SULFATE 325 (65 FE) MG PO TABS: 325.0000 mg | ORAL_TABLET | Freq: Three times a day (TID) | ORAL | Status: DC

## 2013-12-08 MED ORDER — SIMVASTATIN 40 MG PO TABS
40.0000 mg | ORAL_TABLET | Freq: Every day | ORAL | Status: DC
Start: 2013-12-08 — End: 2014-08-07

## 2013-12-08 NOTE — Progress Notes (Signed)
Patient ID: Kirk Carson, male   DOB: 11-Jun-1950, 63 y.o.   MRN: [id number]  CC: HTN f/u   HPI:  Patient presents to clinic today for a follow up of hypertension.  Patient reports that he is being followed by Neurology and has been taking his medications daily.  He denies headaches, vision changes, facial weakness, or speech changes.  Patient states that he does continue to smoke but he is not ready to quit today.    Allergies  Allergen Reactions  . Asa [Aspirin] Nausea And Vomiting   Past Medical History  Diagnosis Date  . Hyperlipidemia   . Hypertension   . Stroke     TIA 08/2013  . History of TIA (transient ischemic attack)   . Tobacco abuse   . Iron deficiency anemia   . Unspecified constipation    Current Outpatient Prescriptions on File Prior to Visit  Medication Sig Dispense Refill  . acetaminophen (TYLENOL) 325 MG tablet Take 650 mg by mouth every 6 (six) hours as needed for mild pain.      Marland Kitchen amLODipine (NORVASC) 5 MG tablet Take 1 tablet (5 mg total) by mouth daily.  30 tablet  1  . aspirin EC 325 MG tablet Take 1 tablet (325 mg total) by mouth daily.  30 tablet  3  . ferrous sulfate (FERROUSUL) 325 (65 FE) MG tablet Take 1 tablet (325 mg total) by mouth 3 (three) times daily with meals.  90 tablet  3  . MOVIPREP 100 G SOLR Take 1 kit (200 g total) by mouth once. Name brand only, movi prep as directed, no substitutions.  1 kit  0  . polyethylene glycol powder (GLYCOLAX/MIRALAX) powder Take 17 g by mouth daily.  255 g  1  . simvastatin (ZOCOR) 40 MG tablet Take 1 tablet (40 mg total) by mouth daily.  30 tablet  2  . vitamin B-12 (CYANOCOBALAMIN) 1000 MCG tablet Take 1 tablet (1,000 mcg total) by mouth daily.  90 tablet  3   No current facility-administered medications on file prior to visit.   Family History  Problem Relation Age of Onset  . Birth defects Mother   . Colon cancer Neg Hx    History   Social History  . Marital Status: Married    Spouse Name: N/A   Number of Children: N/A  . Years of Education: N/A   Occupational History  . Not on file.   Social History Main Topics  . Smoking status: Current Every Day Smoker -- 1.00 packs/day  . Smokeless tobacco: Never Used  . Alcohol Use: Yes     Comment: occasionally  . Drug Use: No  . Sexual Activity: Not on file   Other Topics Concern  . Not on file   Social History Narrative  . No narrative on file    Review of Systems: Constitutional: Negative for fever, chills, diaphoresis, activity change, appetite change and fatigue. HENT: Negative for ear pain, nosebleeds, congestion, facial swelling, rhinorrhea, neck pain, neck stiffness and ear discharge.  Eyes: Negative for pain, discharge, redness, itching and visual disturbance. Respiratory: Negative for cough, choking, chest tightness, shortness of breath, wheezing and stridor.  Cardiovascular: Negative for chest pain, palpitations and leg swelling. Gastrointestinal: Negative for abdominal distention. Genitourinary: Negative for dysuria, urgency, frequency, hematuria, flank pain, decreased urine volume, difficulty urinating and dyspareunia.  Musculoskeletal: Negative for back pain, joint swelling, arthralgias and gait problem. Neurological: Negative for dizziness, tremors, seizures, syncope, facial asymmetry, speech difficulty, weakness, light-headedness, numbness  and headaches.  Hematological: Negative for adenopathy. Does not bruise/bleed easily. Psychiatric/Behavioral: Negative for hallucinations, behavioral problems, confusion, dysphoric mood, decreased concentration and agitation.    Objective:   Filed Vitals:   12/08/13 1216  BP: 141/92  Pulse: 80  Temp: 98.2 F (36.8 C)  Resp: 16   Physical Exam  Vitals reviewed. Constitutional: He is oriented to person, place, and time.  Neck: Normal range of motion.  Cardiovascular: Normal rate, regular rhythm and normal heart sounds.   Pulmonary/Chest: Effort normal and breath sounds  normal.  Abdominal: Soft. Bowel sounds are normal.  Musculoskeletal: He exhibits no edema and no tenderness.  Neurological: He is alert and oriented to person, place, and time. He has normal reflexes. No cranial nerve deficit.  'al.  Lab Results  Component Value Date   WBC 6.5 10/26/2013   HGB 14.7 10/26/2013   HCT 43.8 10/26/2013   MCV 87 10/26/2013   PLT 299 08/23/2013   Lab Results  Component Value Date   CREATININE 0.70 08/23/2013   BUN 8 08/23/2013   NA 141 08/23/2013   K 4.1 08/23/2013   CL 102 08/23/2013   CO2 20 08/23/2013    Lab Results  Component Value Date   HGBA1C 5.9 12/08/2013   Lipid Panel     Component Value Date/Time   CHOL 119 08/23/2013 2351   TRIG 184* 08/23/2013 2351   HDL 26* 08/23/2013 2351   CHOLHDL 4.6 08/23/2013 2351   VLDL 37 08/23/2013 2351   LDLCALC 56 08/23/2013 2351       Assessment and plan:   Kirk Carson was seen today for follow-up.  Diagnoses and associated orders for this visit:  Hx of TIA (transient ischemic attack) and stroke - aspirin EC 325 MG tablet; Take 1 tablet (325 mg total) by mouth daily. - simvastatin (ZOCOR) 40 MG tablet; Take 1 tablet (40 mg total) by mouth daily.  Essential hypertension - amLODipine (NORVASC) 5 MG tablet; Take 1 tablet (5 mg total) by mouth daily.  Anemia, unspecified anemia type - ferrous sulfate (FERROUSUL) 325 (65 FE) MG tablet; Take 1 tablet (325 mg total) by mouth 3 (three) times daily with meals.  Hyperglycemia - Glucose (CBG) - POCT A1C  F/U in 3 months for HTN        Chari Manning, NP-C [facility] [phone] 12/08/2013, 12:39 PM

## 2013-12-08 NOTE — Progress Notes (Signed)
F/U TIA and anemia, Pt stated had no problems

## 2013-12-08 NOTE — Patient Instructions (Signed)
Smoking Cessation Quitting smoking is important to your health and has many advantages. However, it is not always easy to quit since nicotine is a very addictive drug. Oftentimes, people try 3 times or more before being able to quit. This document explains the best ways for you to prepare to quit smoking. Quitting takes hard work and a lot of effort, but you can do it. ADVANTAGES OF QUITTING SMOKING  You will live longer, feel better, and live better.  Your body will feel the impact of quitting smoking almost immediately.  Within 20 minutes, blood pressure decreases. Your pulse returns to its normal level.  After 8 hours, carbon monoxide levels in the blood return to normal. Your oxygen level increases.  After 24 hours, the chance of having a heart attack starts to decrease. Your breath, hair, and body stop smelling like smoke.  After 48 hours, damaged nerve endings begin to recover. Your sense of taste and smell improve.  After 72 hours, the body is virtually free of nicotine. Your bronchial tubes relax and breathing becomes easier.  After 2 to 12 weeks, lungs can hold more air. Exercise becomes easier and circulation improves.  The risk of having a heart attack, stroke, cancer, or lung disease is greatly reduced.  After 1 year, the risk of coronary heart disease is cut in half.  After 5 years, the risk of stroke falls to the same as a nonsmoker.  After 10 years, the risk of lung cancer is cut in half and the risk of other cancers decreases significantly.  After 15 years, the risk of coronary heart disease drops, usually to the level of a nonsmoker.  If you are pregnant, quitting smoking will improve your chances of having a healthy baby.  The people you live with, especially any children, will be healthier.  You will have extra money to spend on things other than cigarettes. QUESTIONS TO THINK ABOUT BEFORE ATTEMPTING TO QUIT You may want to talk about your answers with your  health care provider.  Why do you want to quit?  If you tried to quit in the past, what helped and what did not?  What will be the most difficult situations for you after you quit? How will you plan to handle them?  Who can help you through the tough times? Your family? Friends? A health care provider?  What pleasures do you get from smoking? What ways can you still get pleasure if you quit? Here are some questions to ask your health care provider:  How can you help me to be successful at quitting?  What medicine do you think would be best for me and how should I take it?  What should I do if I need more help?  What is smoking withdrawal like? How can I get information on withdrawal? GET READY  Set a quit date.  Change your environment by getting rid of all cigarettes, ashtrays, matches, and lighters in your home, car, or work. Do not let people smoke in your home.  Review your past attempts to quit. Think about what worked and what did not. GET SUPPORT AND ENCOURAGEMENT You have a better chance of being successful if you have help. You can get support in many ways.  Tell your family, friends, and coworkers that you are going to quit and need their support. Ask them not to smoke around you.  Get individual, group, or telephone counseling and support. Programs are available at local hospitals and health centers. Call   your local health department for information about programs in your area.  Spiritual beliefs and practices may help some smokers quit.  Download a "quit meter" on your computer to keep track of quit statistics, such as how long you have gone without smoking, cigarettes not smoked, and money saved.  Get a self-help book about quitting smoking and staying off tobacco. LEARN NEW SKILLS AND BEHAVIORS  Distract yourself from urges to smoke. Talk to someone, go for a walk, or occupy your time with a task.  Change your normal routine. Take a different route to work.  Drink tea instead of coffee. Eat breakfast in a different place.  Reduce your stress. Take a hot bath, exercise, or read a book.  Plan something enjoyable to do every day. Reward yourself for not smoking.  Explore interactive web-based programs that specialize in helping you quit. GET MEDICINE AND USE IT CORRECTLY Medicines can help you stop smoking and decrease the urge to smoke. Combining medicine with the above behavioral methods and support can greatly increase your chances of successfully quitting smoking.  Nicotine replacement therapy helps deliver nicotine to your body without the negative effects and risks of smoking. Nicotine replacement therapy includes nicotine gum, lozenges, inhalers, nasal sprays, and skin patches. Some may be available over-the-counter and others require a prescription.  Antidepressant medicine helps people abstain from smoking, but how this works is unknown. This medicine is available by prescription.  Nicotinic receptor partial agonist medicine simulates the effect of nicotine in your brain. This medicine is available by prescription. Ask your health care provider for advice about which medicines to use and how to use them based on your health history. Your health care provider will tell you what side effects to look out for if you choose to be on a medicine or therapy. Carefully read the information on the package. Do not use any other product containing nicotine while using a nicotine replacement product.  RELAPSE OR DIFFICULT SITUATIONS Most relapses occur within the first 3 months after quitting. Do not be discouraged if you start smoking again. Remember, most people try several times before finally quitting. You may have symptoms of withdrawal because your body is used to nicotine. You may crave cigarettes, be irritable, feel very hungry, cough often, get headaches, or have difficulty concentrating. The withdrawal symptoms are only temporary. They are strongest  when you first quit, but they will go away within 10-14 days. To reduce the chances of relapse, try to:  Avoid drinking alcohol. Drinking lowers your chances of successfully quitting.  Reduce the amount of caffeine you consume. Once you quit smoking, the amount of caffeine in your body increases and can give you symptoms, such as a rapid heartbeat, sweating, and anxiety.  Avoid smokers because they can make you want to smoke.  Do not let weight gain distract you. Many smokers will gain weight when they quit, usually less than 10 pounds. Eat a healthy diet and stay active. You can always lose the weight gained after you quit.  Find ways to improve your mood other than smoking. FOR MORE INFORMATION  www.smokefree.gov  Document Released: 03/25/2001 Document Revised: 08/15/2013 Document Reviewed: 07/10/2011 Exit[employer] Patient Information 2015 [employer]. This information is not intended to replace advice given to you by your health care provider. Make sure you discuss any questions you have with your health care provider.  

## 2013-12-12 ENCOUNTER — Institutional Professional Consult (permissible substitution): Payer: 59 | Admitting: Internal Medicine

## 2013-12-29 ENCOUNTER — Encounter: Payer: Self-pay | Admitting: Internal Medicine

## 2014-01-02 ENCOUNTER — Ambulatory Visit: Payer: 59 | Admitting: Neurology

## 2014-01-04 ENCOUNTER — Encounter: Payer: 59 | Admitting: Internal Medicine

## 2014-01-09 ENCOUNTER — Ambulatory Visit (INDEPENDENT_AMBULATORY_CARE_PROVIDER_SITE_OTHER): Payer: 59 | Admitting: Internal Medicine

## 2014-01-09 ENCOUNTER — Encounter: Payer: Self-pay | Admitting: Internal Medicine

## 2014-01-09 VITALS — BP 122/64 | HR 69 | Ht 67.0 in | Wt 176.8 lb

## 2014-01-09 DIAGNOSIS — F17200 Nicotine dependence, unspecified, uncomplicated: Secondary | ICD-10-CM

## 2014-01-09 DIAGNOSIS — I10 Essential (primary) hypertension: Secondary | ICD-10-CM

## 2014-01-09 DIAGNOSIS — I639 Cerebral infarction, unspecified: Secondary | ICD-10-CM

## 2014-01-09 DIAGNOSIS — I6350 Cerebral infarction due to unspecified occlusion or stenosis of unspecified cerebral artery: Secondary | ICD-10-CM

## 2014-01-09 NOTE — Patient Instructions (Signed)
Your physician recommends that you continue on your current medications as directed. Please refer to the Current Medication list given to you today. Your physician discussed an implantable loop recorder today. If you decide you would like to proceed with receiving this recorder, please call back to Dr. Jackalyn Lombard office to discuss further.

## 2014-01-09 NOTE — Progress Notes (Signed)
ELECTROPHYSIOLOGY CONSULT NOTE  Patient ID: Kirk Carson MRN: [id number], DOB/AGE: 09/24/1950   Primary Physician: Chari Manning, NP Reason for Consultation: Cryptogenic stroke; recommendations regarding Implantable Loop Recorder  History of Present Illness Kirk Carson was admitted on 5/15 with acute CVA.  MRI at that time reveals multiple embolic events in multiple territories.   he was been monitored on telemetry which has demonstrated no arrhythmias.  No cause has been identified. Inpatient stroke work-up including TEE were unremarkable.  He was evaluated by Dr Leonie Man and recommendation for a 30 day event monitor was made.  The patient never had his 30 day monitor placed.  He states that he cannot wear a monitor while working in [employer] our [employer] due to concerns of safety.   EP has been asked to evaluate for placement of an implantable loop recorder to monitor for atrial fibrillation.  Past Medical History Past Medical History  Diagnosis Date  . Hyperlipidemia   . Hypertension   . Stroke     TIA 08/2013  . History of TIA (transient ischemic attack)   . Tobacco abuse   . Iron deficiency anemia   . Unspecified constipation   . 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 Tee without cardioversion N/A 08/24/2013    Procedure: TRANSESOPHAGEAL ECHOCARDIOGRAM (TEE);  Surgeon: Josue Hector, MD;  Location: [facility] ENDOSCOPY;  Service: Cardiovascular;  Laterality: N/A;    Allergies/Intolerances Allergies  Allergen Reactions  . Asa [Aspirin] Other (See Comments)    Occasionally causes heartburn (only with 325 mg tablets)   Inpatient Medications   Social History History   Social History  . Marital Status: Married    Spouse Name: N/A    Number of Children: N/A  . Years of Education: N/A   Occupational History  . Not on file.   Social History Main Topics  . Smoking  status: Current Every Day Smoker -- 1.00 packs/day  . Smokeless tobacco: Never Used  . Alcohol Use: Yes     Comment: occasionally  . Drug Use: No  . Sexual Activity: Not on file   Other Topics Concern  . Not on file   Social History Narrative   Lives in [place].  Works in [employer].    Review of Systems He has cough and occasional wheezing.  All other systems reviewed and are otherwise negative except as noted above.  Physical Exam Blood pressure 122/64, pulse 69, height 5\' 7"  (1.702 m), weight 176 lb 12.8 oz (80.196 kg).  General: Well developed, well appearing 63 y.o. male in no acute distress. HEENT: Normocephalic, atraumatic. EOMs intact. Sclera nonicteric. Oropharynx clear.  Neck: Supple without bruits. No JVD. Lungs: Respirations regular and unlabored, CTA bilaterally. No wheezes, rales or rhonchi. Heart: RRR. S1, S2 present. No murmurs, rub, S3 or S4. Abdomen: Soft, non-tender, non-distended. BS present x 4 quadrants. No hepatosplenomegaly.  Extremities: No clubbing, cyanosis or edema. DP/PT/Radials 2+ and equal bilaterally. Psych: Normal affect. Neuro: Alert and oriented X 3. Moves all extremities spontaneously. Musculoskeletal: No kyphosis. Skin: Intact. Warm and dry. No rashes or petechiae in exposed areas.   Labs Lab Results  Component Value Date   WBC 6.5 10/26/2013   HGB 14.7 10/26/2013   HCT 43.8 10/26/2013   MCV 87 10/26/2013   PLT 299 08/23/2013   No results found for this basename: NA, K, CL, CO2, BUN, CREATININE,  CALCIUM, LABALBU, PROT, BILITOT, ALKPHOS, ALT, AST, GLUCOSE,  in the last 168 hours No results found for this basename: INR,  in the last 72 hours  Radiology/Studies No results found.  Echocardiogram  reviewed  12-lead ECG all ekgs in epic are reviewed and reveal only sinus rhythm  Assessment and Plan:  1. Cryptogenic stroke The patient presents with cryptogenic stroke which were felt to be embolic in nature.  He cannot wear a 30  day monitor due to job restrictions.  I spoke at length with the patient about monitoring for afib with an implantable loop recorder.  Risks, benefits, and alteratives to implantable loop recorder were discussed with the patient today.  Unfortunately, the patient is very overwhelmed with the costs related to his recent hospitalization.  He says that he has no money to cover costs of an outpatient monitor.  He would only be willing to proceed if his insurance can cover the device.  He would like to discuss this with his insurance carrier and will contact our office if he wishes to proceed.  I did discuss RIO II as an option for him if he decides to proceed.  This may offer the option of an outpatient implant (if randomized to that particular arm).  He will follow-up with neurology as scheduled.   2. HTN Stable No change required today  3. Tobacco I have strongly encouraged cessation

## 2014-01-16 ENCOUNTER — Ambulatory Visit: Payer: Self-pay | Admitting: Nurse Practitioner

## 2014-01-17 ENCOUNTER — Telehealth: Payer: Self-pay | Admitting: *Deleted

## 2014-01-17 NOTE — Telephone Encounter (Signed)
Called patient to follow up on ILR. He was going to check with insurance company to see if it would cover loop implant for detection of atrial fibrillation.  Left message with patient's family member.  He will ask the patient to call back to let us know what he found out from [facility].

## 2014-01-25 ENCOUNTER — Ambulatory Visit (AMBULATORY_SURGERY_CENTER): Payer: Self-pay | Admitting: *Deleted

## 2014-01-25 VITALS — Ht 67.0 in | Wt 175.0 lb

## 2014-01-25 DIAGNOSIS — Z1211 Encounter for screening for malignant neoplasm of colon: Secondary | ICD-10-CM

## 2014-01-25 MED ORDER — MOVIPREP 100 G PO SOLR: ORAL | Status: DC

## 2014-01-25 NOTE — Progress Notes (Signed)
Patient denies any allergies to eggs or soy. Patient denies any problems with anesthesia/sedation. Patient denies any oxygen use at home and does not take any diet/weight loss medications.  No computer access per pt.

## 2014-02-06 ENCOUNTER — Encounter: Payer: 59 | Admitting: Internal Medicine

## 2014-02-08 ENCOUNTER — Ambulatory Visit (AMBULATORY_SURGERY_CENTER): Payer: 59 | Admitting: Internal Medicine

## 2014-02-08 ENCOUNTER — Encounter: Payer: Self-pay | Admitting: Internal Medicine

## 2014-02-08 VITALS — BP 135/70 | HR 60 | Temp 97.5°F | Resp 20 | Ht 67.0 in | Wt 175.0 lb

## 2014-02-08 DIAGNOSIS — D125 Benign neoplasm of sigmoid colon: Secondary | ICD-10-CM

## 2014-02-08 DIAGNOSIS — K635 Polyp of colon: Secondary | ICD-10-CM

## 2014-02-08 DIAGNOSIS — D124 Benign neoplasm of descending colon: Secondary | ICD-10-CM

## 2014-02-08 DIAGNOSIS — Z1211 Encounter for screening for malignant neoplasm of colon: Secondary | ICD-10-CM

## 2014-02-08 MED ORDER — SODIUM CHLORIDE 0.9 % IV SOLN: 500.0000 mL | INTRAVENOUS | Status: DC

## 2014-02-08 NOTE — Patient Instructions (Signed)
YOU HAD AN ENDOSCOPIC PROCEDURE TODAY AT THE [facility]: Refer to the procedure report that was given to you for any specific questions about what was found during the examination.  If the procedure report does not answer your questions, please call your gastroenterologist to clarify.  If you requested that your care partner not be given the details of your procedure findings, then the procedure report has been included in a sealed envelope for you to review at your convenience later.  YOU SHOULD EXPECT: Some feelings of bloating in the abdomen. Passage of more gas than usual.  Walking can help get rid of the air that was put into your GI tract during the procedure and reduce the bloating. If you had a lower endoscopy (such as a colonoscopy or flexible sigmoidoscopy) you may notice spotting of blood in your stool or on the toilet paper. If you underwent a bowel prep for your procedure, then you may not have a normal bowel movement for a few days.  DIET: Your first meal following the procedure should be a light meal and then it is ok to progress to your normal diet.  A half-sandwich or bowl of soup is an example of a good first meal.  Heavy or fried foods are harder to digest and may make you feel nauseous or bloated.  Likewise meals heavy in dairy and vegetables can cause extra gas to form and this can also increase the bloating.  Drink plenty of fluids but you should avoid alcoholic beverages for 24 hours.  ACTIVITY: Your care partner should take you home directly after the procedure.  You should plan to take it easy, moving slowly for the rest of the day.  You can resume normal activity the day after the procedure however you should NOT DRIVE or use heavy machinery for 24 hours (because of the sedation medicines used during the test).    SYMPTOMS TO REPORT IMMEDIATELY: A gastroenterologist can be reached at any hour.  During normal business hours, 8:30 AM to 5:00 PM Monday through Friday,  call [phone].  After hours and on weekends, please call the GI answering service at [phone] who will take a message and have the physician on call contact you.   Following lower endoscopy (colonoscopy or flexible sigmoidoscopy):  Excessive amounts of blood in the stool  Significant tenderness or worsening of abdominal pains  Swelling of the abdomen that is new, acute  Fever of 100F or higher  FOLLOW UP: If any biopsies were taken you will be contacted by phone or by letter within the next 1-3 weeks.  Call your gastroenterologist if you have not heard about the biopsies in 3 weeks.  Our staff will call the home number listed on your records the next business day following your procedure to check on you and address any questions or concerns that you may have at that time regarding the information given to you following your procedure. This is a courtesy call and so if there is no answer at the home number and we have not heard from you through the emergency physician on call, we will assume that you have returned to your regular daily activities without incident.  SIGNATURES/CONFIDENTIALITY: You and/or your care partner have signed paperwork which will be entered into your electronic medical record.  These signatures attest to the fact that that the information above on your After Visit Summary has been reviewed and is understood.  Full responsibility of the confidentiality of this   discharge information lies with you and/or your care-partner.  Await pathology  Please read over handout about polyps  Continue your normal medications

## 2014-02-08 NOTE — Op Note (Signed)
[place]  [address] [place], [id number]   COLONOSCOPY PROCEDURE REPORT  PATIENT: Kirk Carson, Kirk Carson  MR#: [id number] BIRTHDATE: [birth year] , 63  yrs. old GENDER: male ENDOSCOPIST: Jerene Bears, MD REFERRED BY: Chari Manning PROCEDURE DATE:  02/08/2014 PROCEDURE:   Colonoscopy with snare polypectomy First Screening Colonoscopy - Avg.  risk and is 50 yrs.  old or older Yes.  Prior Negative Screening - Now for repeat screening. N/A  History of Adenoma - Now for follow-up colonoscopy & has been > or = to 3 yrs.  N/A  Polyps Removed Today? Yes. ASA CLASS:   Class III INDICATIONS:average risk for colorectal cancer and first colonoscopy. MEDICATIONS: Monitored anesthesia care and Propofol 250 mg IV  DESCRIPTION OF PROCEDURE:   After the risks benefits and alternatives of the procedure were thoroughly explained, informed consent was obtained.  The digital rectal exam revealed no rectal mass.   The LB [id number]  endoscope was introduced through the anus and advanced to the cecum, which was identified by both the appendix and ileocecal valve. No adverse events experienced. The quality of the prep was good, using MoviPrep  The instrument was then slowly withdrawn as the colon was fully examined.   COLON FINDINGS: Two sessile polyps ranging between 3-33mm in size were found in the descending colon and sigmoid colon. Polypectomies were performed with a cold snare.  The resection was complete, the polyp tissue was completely retrieved and sent to histology.   The examination was otherwise normal.  Retroflexed views revealed no abnormalities. The time to cecum=6 minutes 11 seconds.  Withdrawal time=11 minutes 40 seconds.  The scope was withdrawn and the procedure completed. COMPLICATIONS: There were no immediate complications.  ENDOSCOPIC IMPRESSION: 1.   Two sessile polyps ranging between 3-38mm in size were found in the descending colon and sigmoid colon;  polypectomies were performed with a cold snare 2.   The examination was otherwise normal  RECOMMENDATIONS: 1.  Await pathology results 2.  If the polyps removed today are proven to be adenomatous (pre-cancerous) polyps, you will need a repeat colonoscopy in 5 years.  Otherwise you should continue to follow colorectal cancer screening guidelines for "routine risk" patients with colonoscopy in 10 years.  You will receive a letter within 1-2 weeks with the results of your biopsy as well as final recommendations.  Please call my office if you have not received a letter after 3 weeks.  eSigned:  Jerene Bears, MD 02/08/2014 11:55 AM   cc:  The Patient, PCP

## 2014-02-08 NOTE — Progress Notes (Signed)
A/ox3 pleased with MAC, report to Kristin RN 

## 2014-02-08 NOTE — Progress Notes (Signed)
Called to room to assist during endoscopic procedure.  Patient ID and intended procedure confirmed with present staff. Received instructions for my participation in the procedure from the performing physician.  

## 2014-02-09 ENCOUNTER — Telehealth: Payer: Self-pay | Admitting: *Deleted

## 2014-02-09 NOTE — Telephone Encounter (Signed)
No answer, message left for the patient. 

## 2014-02-15 ENCOUNTER — Encounter: Payer: Self-pay | Admitting: Internal Medicine

## 2014-02-26 ENCOUNTER — Encounter (HOSPITAL_BASED_OUTPATIENT_CLINIC_OR_DEPARTMENT_OTHER): Payer: Self-pay | Admitting: *Deleted

## 2014-02-26 ENCOUNTER — Emergency Department (HOSPITAL_BASED_OUTPATIENT_CLINIC_OR_DEPARTMENT_OTHER): Payer: Worker's Compensation

## 2014-02-26 ENCOUNTER — Emergency Department (HOSPITAL_BASED_OUTPATIENT_CLINIC_OR_DEPARTMENT_OTHER)
Admission: EM | Admit: 2014-02-26 | Discharge: 2014-02-26 | Disposition: A | Discharge status: Home / Self Care | Payer: Worker's Compensation | Attending: Emergency Medicine | Admitting: Emergency Medicine

## 2014-02-26 DIAGNOSIS — S61218A Laceration without foreign body of other finger without damage to nail, initial encounter: Secondary | ICD-10-CM

## 2014-02-26 DIAGNOSIS — Z720 Tobacco use: Secondary | ICD-10-CM | POA: Insufficient documentation

## 2014-02-26 DIAGNOSIS — S61214A Laceration without foreign body of right ring finger without damage to nail, initial encounter: Secondary | ICD-10-CM | POA: Insufficient documentation

## 2014-02-26 DIAGNOSIS — Y998 Other external cause status: Secondary | ICD-10-CM | POA: Diagnosis not present

## 2014-02-26 DIAGNOSIS — E785 Hyperlipidemia, unspecified: Secondary | ICD-10-CM | POA: Insufficient documentation

## 2014-02-26 DIAGNOSIS — W230XXA Caught, crushed, jammed, or pinched between moving objects, initial encounter: Secondary | ICD-10-CM | POA: Diagnosis not present

## 2014-02-26 DIAGNOSIS — Y9389 Activity, other specified: Secondary | ICD-10-CM | POA: Insufficient documentation

## 2014-02-26 DIAGNOSIS — Z8673 Personal history of transient ischemic attack (TIA), and cerebral infarction without residual deficits: Secondary | ICD-10-CM | POA: Diagnosis not present

## 2014-02-26 DIAGNOSIS — S62635A Displaced fracture of distal phalanx of left ring finger, initial encounter for closed fracture: Secondary | ICD-10-CM | POA: Diagnosis not present

## 2014-02-26 DIAGNOSIS — Y929 Unspecified place or not applicable: Secondary | ICD-10-CM | POA: Insufficient documentation

## 2014-02-26 DIAGNOSIS — Z862 Personal history of diseases of the blood and blood-forming organs and certain disorders involving the immune mechanism: Secondary | ICD-10-CM | POA: Diagnosis not present

## 2014-02-26 DIAGNOSIS — Z8719 Personal history of other diseases of the digestive system: Secondary | ICD-10-CM | POA: Insufficient documentation

## 2014-02-26 DIAGNOSIS — S6990XA Unspecified injury of unspecified wrist, hand and finger(s), initial encounter: Secondary | ICD-10-CM

## 2014-02-26 DIAGNOSIS — S62639A Displaced fracture of distal phalanx of unspecified finger, initial encounter for closed fracture: Secondary | ICD-10-CM

## 2014-02-26 MED ORDER — LIDOCAINE HCL (PF) 1 % IJ SOLN
INTRAMUSCULAR | Status: AC
  Administered 2014-02-26: 07:00:00
  Filled 2014-02-26: qty 5

## 2014-02-26 MED ORDER — TETANUS-DIPHTH-ACELL PERTUSSIS 5-2.5-18.5 LF-MCG/0.5 IM SUSP
0.5000 mL | Freq: Once | INTRAMUSCULAR | Status: AC
  Administered 2014-02-26: 0.5 mL via INTRAMUSCULAR
  Filled 2014-02-26: qty 0.5

## 2014-02-26 MED ORDER — CEPHALEXIN 500 MG PO CAPS: 500.0000 mg | ORAL_CAPSULE | Freq: Four times a day (QID) | ORAL | Status: DC

## 2014-02-26 MED ORDER — HYDROCODONE-ACETAMINOPHEN 5-325 MG PO TABS: 1.0000 | ORAL_TABLET | ORAL | Status: DC | PRN

## 2014-02-26 NOTE — ED Notes (Signed)
MD at bedside with pt

## 2014-02-26 NOTE — ED Provider Notes (Signed)
CSN: [id number]     Arrival date & time 02/26/14  0347 History   First MD Initiated Contact with Patient 02/26/14 [phone]     Chief Complaint  Patient presents with  . Finger Injury     (Consider location/radiation/quality/duration/timing/severity/associated sxs/prior Treatment) HPI Patient is right-hand dominant who presents after getting his finger caught in a printing press. Sustaining laceration to the fourth digit of the left hand on the palmar surface at the level of the DIP. Patient with swelling distal to the injury site. Mild decrease in sensation. Decreased range of motion. Unknown last tetanus Past Medical History  Diagnosis Date  . Hyperlipidemia   . Hypertension   . Stroke     TIA 08/2013  . History of TIA (transient ischemic attack)   . Tobacco abuse   . Iron deficiency anemia   . Unspecified constipation   . 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 Tee without cardioversion N/A 08/24/2013    Procedure: TRANSESOPHAGEAL ECHOCARDIOGRAM (TEE);  Surgeon: Josue Hector, MD;  Location: [facility] ENDOSCOPY;  Service: Cardiovascular;  Laterality: N/A;   Family History  Problem Relation Age of Onset  . Birth defects Mother   . Colon cancer Neg Hx    History  Substance Use Topics  . Smoking status: Current Every Day Smoker -- 1.00 packs/day    Types: Cigarettes  . Smokeless tobacco: Never Used  . Alcohol Use: Yes     Comment: occasionally    Review of Systems  Musculoskeletal: Positive for joint swelling.  Skin: Positive for wound.  Neurological: Positive for numbness. Negative for weakness.  All other systems reviewed and are negative.     Allergies  Asa  Home Medications   Prior to Admission medications   Medication Sig Start Date End Date Taking? Authorizing Provider  acetaminophen (TYLENOL) 325 MG tablet Take 650 mg by mouth every 6 (six) hours as needed for mild pain.    Historical Provider,  MD  amLODipine (NORVASC) 5 MG tablet Take 1 tablet (5 mg total) by mouth daily. 12/08/13   Lance Bosch, NP  aspirin EC 325 MG tablet Take 1 tablet (325 mg total) by mouth daily. 12/08/13   Lance Bosch, NP  IRON PO Take 1 tablet by mouth 3 (three) times daily.    Historical Provider, MD  polyethylene glycol powder (GLYCOLAX/MIRALAX) powder Take 17 g by mouth daily. 08/31/13   Lance Bosch, NP  simvastatin (ZOCOR) 40 MG tablet Take 1 tablet (40 mg total) by mouth daily. 12/08/13   Lance Bosch, NP  vitamin B-12 (CYANOCOBALAMIN) 1000 MCG tablet Take 1 tablet (1,000 mcg total) by mouth daily. 10/28/13   Rosalin Hawking, MD   BP 182/86 mmHg  Pulse 94  Temp(Src) 98.3 F (36.8 C) (Oral)  Resp 18  Ht 5\' 7"  (1.702 m)  Wt 180 lb (81.647 kg)  BMI 28.19 kg/m2  SpO2 96% Physical Exam  Constitutional: He is oriented to person, place, and time. He appears well-developed and well-nourished. No distress.  HENT:  Head: Normocephalic and atraumatic.  Cardiovascular: Normal rate.   Pulmonary/Chest: Effort normal.  Musculoskeletal: He exhibits edema and tenderness.  Patient with a 1 cm as first laceration at the level of the DIP of the fourth digit of the left hand on the palmar surface. Mild decrease swelling. Patient does have flexion which is intact at the site of the DIP and PIP. No  obvious tendon injury.  Neurological: He is alert and oriented to person, place, and time.  Decreased sensation of the distal left fourth digit compared to other fingers though it is still intact.  Skin: Skin is warm.  Psychiatric: He has a normal mood and affect. His behavior is normal.    ED Course  LACERATION REPAIR Date/Time: 02/26/2014 6:57 AM Performed by: Julianne Rice Authorized by: Lita Mains, Thamas Appleyard Body area: upper extremity Location details: left ring finger Laceration length: 1 cm Foreign bodies: no foreign bodies Vascular damage: no Anesthesia: digital block Local anesthetic: lidocaine 1% without  epinephrine Anesthetic total: 4 ml Patient sedated: no Irrigation solution: saline Irrigation method: jet lavage Amount of cleaning: extensive Debridement: none Degree of undermining: none Skin closure: 4-0 nylon Number of sutures: 2 Technique: simple Approximation: close Approximation difficulty: simple Dressing: antibiotic ointment and gauze roll Comments: A shunt with decreased flexion at the DIP joint.. Difficult to visualize the base of the laceration. No definite injury to tendons visualized. Wound thoroughly irrigated and closed in a sterile manner.   (including critical care time) Labs Review Labs Reviewed - No data to display  Imaging Review No results found.   EKG Interpretation None      MDM   Final diagnoses:  Injury of ring finger   Discussed with Dr. Lenon Curt. Agrees with plan to follow-up in office. Advise call office to arrange appointment on Monday. Patient has been given return precautions and has voiced understanding.    Julianne Rice, MD 02/26/14 [phone]

## 2014-02-26 NOTE — ED Notes (Signed)
Transported to [employer]

## 2014-02-26 NOTE — ED Notes (Signed)
Returned from xray

## 2014-02-26 NOTE — ED Notes (Addendum)
Pt states that he was at work this morning and got his left ring finger caught in a printer. Left tip of finger with swelling and ant finger tip with laceration that bleeding is controlled. Pt reports no other injury. This is a [employer] and states they require a UDS. ([employer]) Pt able to bend finger and has feeling to finger.

## 2014-02-26 NOTE — Discharge Instructions (Signed)
Call Dr. Brennan Bailey office on Monday morning to arrange follow-up appointment. Take medication as prescribed. Keep the laceration clean and dry. Turn immediately for worsening swelling, redness, warmth, fever, pain or for any concerns.  Finger Fracture Fractures of fingers are breaks in the bones of the fingers. There are many types of fractures. There are different ways of treating these fractures. Your health care provider will discuss the best way to treat your fracture. CAUSES Traumatic injury is the main cause of broken fingers. These include:  Injuries while playing sports.  Workplace injuries.  Falls. RISK FACTORS Activities that can increase your risk of finger fractures include:  Sports.  Workplace activities that involve machinery.  A condition called osteoporosis, which can make your bones less dense and cause them to fracture more easily. SIGNS AND SYMPTOMS The main symptoms of a broken finger are pain and swelling within 15 minutes after the injury. Other symptoms include:  Bruising of your finger.  Stiffness of your finger.  Numbness of your finger.  Exposed bones (compound fracture) if the fracture is severe. DIAGNOSIS  The best way to diagnose a broken bone is with X-ray imaging. Additionally, your health care provider will use this X-ray image to evaluate the position of the broken finger bones.  TREATMENT  Finger fractures can be treated with:   Nonreduction--This means the bones are in place. The finger is splinted without changing the positions of the bone pieces. The splint is usually left on for about a week to 10 days. This will depend on your fracture and what your health care provider thinks.  Closed reduction--The bones are put back into position without using surgery. The finger is then splinted.  Open reduction and internal fixation--The fracture site is opened. Then the bone pieces are fixed into place with pins or some type of hardware. This is seldom  required. It depends on the severity of the fracture. HOME CARE INSTRUCTIONS   Follow your health care provider's instructions regarding activities, exercises, and physical therapy.  Only take over-the-counter or prescription medicines for pain, discomfort, or fever as directed by your health care provider. SEEK MEDICAL CARE IF: You have pain or swelling that limits the motion or use of your fingers. SEEK IMMEDIATE MEDICAL CARE IF:  Your finger becomes numb. MAKE SURE YOU:   Understand these instructions.  Will watch your condition.  Will get help right away if you are not doing well or get worse. Document Released: 07/13/2000 Document Revised: 01/19/2013 Document Reviewed: 11/10/2012 [facility] Patient Information 2015 [place], [place]. This information is not intended to replace advice given to you by your health care provider. Make sure you discuss any questions you have with your health care provider.  Laceration Care, Adult A laceration is a cut or lesion that goes through all layers of the skin and into the tissue just beneath the skin. TREATMENT  Some lacerations may not require closure. Some lacerations may not be able to be closed due to an increased risk of infection. It is important to see your caregiver as soon as possible after an injury to minimize the risk of infection and maximize the opportunity for successful closure. If closure is appropriate, pain medicines may be given, if needed. The wound will be cleaned to help prevent infection. Your caregiver will use stitches (sutures), staples, wound glue (adhesive), or skin adhesive strips to repair the laceration. These tools bring the skin edges together to allow for faster healing and a better cosmetic outcome. However, all wounds  will heal with a scar. Once the wound has healed, scarring can be minimized by covering the wound with sunscreen during the day for 1 full year. HOME CARE INSTRUCTIONS  For sutures or staples:  Keep  the wound clean and dry.  If you were given a bandage (dressing), you should change it at least once a day. Also, change the dressing if it becomes wet or dirty, or as directed by your caregiver.  Wash the wound with soap and water 2 times a day. Rinse the wound off with water to remove all soap. Pat the wound dry with a clean towel.  After cleaning, apply a thin layer of the antibiotic ointment as recommended by your caregiver. This will help prevent infection and keep the dressing from sticking.  You may shower as usual after the first 24 hours. Do not soak the wound in water until the sutures are removed.  Only take over-the-counter or prescription medicines for pain, discomfort, or fever as directed by your caregiver.  Get your sutures or staples removed as directed by your caregiver. For skin adhesive strips:  Keep the wound clean and dry.  Do not get the skin adhesive strips wet. You may bathe carefully, using caution to keep the wound dry.  If the wound gets wet, pat it dry with a clean towel.  Skin adhesive strips will fall off on their own. You may trim the strips as the wound heals. Do not remove skin adhesive strips that are still stuck to the wound. They will fall off in time. For wound adhesive:  You may briefly wet your wound in the shower or bath. Do not soak or scrub the wound. Do not swim. Avoid periods of heavy perspiration until the skin adhesive has fallen off on its own. After showering or bathing, gently pat the wound dry with a clean towel.  Do not apply liquid medicine, cream medicine, or ointment medicine to your wound while the skin adhesive is in place. This may loosen the film before your wound is healed.  If a dressing is placed over the wound, be careful not to apply tape directly over the skin adhesive. This may cause the adhesive to be pulled off before the wound is healed.  Avoid prolonged exposure to sunlight or tanning lamps while the skin adhesive is  in place. Exposure to ultraviolet light in the first year will darken the scar.  The skin adhesive will usually remain in place for 5 to 10 days, then naturally fall off the skin. Do not pick at the adhesive film. You may need a tetanus shot if:  You cannot remember when you had your last tetanus shot.  You have never had a tetanus shot. If you get a tetanus shot, your arm may swell, get red, and feel warm to the touch. This is common and not a problem. If you need a tetanus shot and you choose not to have one, there is a rare chance of getting tetanus. Sickness from tetanus can be serious. SEEK MEDICAL CARE IF:   You have redness, swelling, or increasing pain in the wound.  You see a red line that goes away from the wound.  You have yellowish-white fluid (pus) coming from the wound.  You have a fever.  You notice a bad smell coming from the wound or dressing.  Your wound breaks open before or after sutures have been removed.  You notice something coming out of the wound such as wood or glass.  Your wound is on your hand or foot and you cannot move a finger or toe. SEEK IMMEDIATE MEDICAL CARE IF:   Your pain is not controlled with prescribed medicine.  You have severe swelling around the wound causing pain and numbness or a change in color in your arm, hand, leg, or foot.  Your wound splits open and starts bleeding.  You have worsening numbness, weakness, or loss of function of any joint around or beyond the wound.  You develop painful lumps near the wound or on the skin anywhere on your body. MAKE SURE YOU:   Understand these instructions.  Will watch your condition.  Will get help right away if you are not doing well or get worse. Document Released: 03/31/2005 Document Revised: 06/23/2011 Document Reviewed: 09/24/2010 [facility] Patient Information 2015 [place], [place]. This information is not intended to replace advice given to you by your health care provider. Make sure  you discuss any questions you have with your health care provider.

## 2014-03-28 ENCOUNTER — Telehealth: Payer: Self-pay | Admitting: Radiology

## 2014-03-28 NOTE — Telephone Encounter (Signed)
Unable to schedule dopplers.

## 2014-06-28 ENCOUNTER — Other Ambulatory Visit: Payer: Self-pay | Admitting: Internal Medicine

## 2014-07-06 ENCOUNTER — Other Ambulatory Visit: Payer: Self-pay | Admitting: Internal Medicine

## 2014-08-07 ENCOUNTER — Other Ambulatory Visit: Payer: Self-pay | Admitting: Internal Medicine

## 2014-08-14 ENCOUNTER — Other Ambulatory Visit: Payer: Self-pay | Admitting: Internal Medicine

## 2014-10-16 ENCOUNTER — Other Ambulatory Visit: Payer: Self-pay | Admitting: Internal Medicine

## 2014-10-23 NOTE — Telephone Encounter (Signed)
Travis from [facility] called requesting refills on amlodipine '5mg'$  and simvastatin 40 mg for this patient. Both refills were ordered on 10/16/14, but the orders are still showing as pending.

## 2014-10-24 ENCOUNTER — Telehealth: Payer: Self-pay | Admitting: General Practice

## 2014-10-24 NOTE — Telephone Encounter (Signed)
Received call as transfer from [place]. Heather requested for me to schedule appointment for patient, as patient had called for medication refill, but has not been seen since 12/08/13.. Offered patient same day appointment but patient states he was unable to come in today. Patient stated that he would call us back and schedule once he knows his availability.

## 2014-10-24 NOTE — Telephone Encounter (Signed)
Nurse called patient, patient verified date of birth. Nurse advised patient he needs office visit before getting prescriptions refilled, due to last seen in 11/2013. Patient angry and requested to see another doctor. Nurse offered patient appointment today at 12pm with Dr. Adrian Blackwater. Patient does not want appointment today. Nurse explained it amy be 1-2 weeks for next available appointment.  Patient agrees to be transferred to front office staff to schedule next available appointment.

## 2014-10-26 NOTE — Telephone Encounter (Signed)
Nurse called patient, someone answered telephone, informed nurse of pateint sleeping. Nurse requested patient to return call to [facility] at [phone]. Nurse calling patient to schedule appointment with PCP. Patient needs refill of simvastatin. Nurse will fill 1 month supply, patient must schedule appointment for additional refills.

## 2014-11-30 ENCOUNTER — Other Ambulatory Visit: Payer: Self-pay | Admitting: Internal Medicine

## 2015-01-28 ENCOUNTER — Other Ambulatory Visit: Payer: Self-pay | Admitting: Neurology

## 2017-08-09 ENCOUNTER — Emergency Department (HOSPITAL_COMMUNITY): Payer: Self-pay

## 2017-08-09 ENCOUNTER — Other Ambulatory Visit: Payer: Self-pay

## 2017-08-09 ENCOUNTER — Inpatient Hospital Stay (HOSPITAL_COMMUNITY)
Admission: EM | Admit: 2017-08-09 | Discharge: 2017-08-21 | DRG: 871 | Disposition: A | Discharge status: Hospice | Payer: Self-pay | Attending: Internal Medicine | Admitting: Internal Medicine

## 2017-08-09 ENCOUNTER — Encounter (HOSPITAL_COMMUNITY): Payer: Self-pay | Admitting: *Deleted

## 2017-08-09 DIAGNOSIS — Z66 Do not resuscitate: Secondary | ICD-10-CM | POA: Diagnosis present

## 2017-08-09 DIAGNOSIS — E870 Hyperosmolality and hypernatremia: Secondary | ICD-10-CM | POA: Diagnosis present

## 2017-08-09 DIAGNOSIS — E875 Hyperkalemia: Secondary | ICD-10-CM | POA: Diagnosis present

## 2017-08-09 DIAGNOSIS — I5032 Chronic diastolic (congestive) heart failure: Secondary | ICD-10-CM | POA: Diagnosis present

## 2017-08-09 DIAGNOSIS — F17210 Nicotine dependence, cigarettes, uncomplicated: Secondary | ICD-10-CM | POA: Diagnosis present

## 2017-08-09 DIAGNOSIS — I110 Hypertensive heart disease with heart failure: Secondary | ICD-10-CM | POA: Diagnosis present

## 2017-08-09 DIAGNOSIS — G131 Other systemic atrophy primarily affecting central nervous system in neoplastic disease: Secondary | ICD-10-CM | POA: Diagnosis present

## 2017-08-09 DIAGNOSIS — Z7189 Other specified counseling: Secondary | ICD-10-CM

## 2017-08-09 DIAGNOSIS — J181 Lobar pneumonia, unspecified organism: Secondary | ICD-10-CM

## 2017-08-09 DIAGNOSIS — A419 Sepsis, unspecified organism: Principal | ICD-10-CM | POA: Diagnosis present

## 2017-08-09 DIAGNOSIS — IMO0002 Reserved for concepts with insufficient information to code with codable children: Secondary | ICD-10-CM

## 2017-08-09 DIAGNOSIS — J189 Pneumonia, unspecified organism: Secondary | ICD-10-CM | POA: Diagnosis present

## 2017-08-09 DIAGNOSIS — Z515 Encounter for palliative care: Secondary | ICD-10-CM | POA: Diagnosis present

## 2017-08-09 DIAGNOSIS — R000 Tachycardia, unspecified: Secondary | ICD-10-CM | POA: Diagnosis present

## 2017-08-09 DIAGNOSIS — C7931 Secondary malignant neoplasm of brain: Secondary | ICD-10-CM | POA: Diagnosis present

## 2017-08-09 DIAGNOSIS — C3411 Malignant neoplasm of upper lobe, right bronchus or lung: Secondary | ICD-10-CM | POA: Diagnosis present

## 2017-08-09 DIAGNOSIS — R229 Localized swelling, mass and lump, unspecified: Secondary | ICD-10-CM

## 2017-08-09 DIAGNOSIS — R4189 Other symptoms and signs involving cognitive functions and awareness: Secondary | ICD-10-CM | POA: Diagnosis present

## 2017-08-09 DIAGNOSIS — R042 Hemoptysis: Secondary | ICD-10-CM | POA: Diagnosis present

## 2017-08-09 DIAGNOSIS — C7989 Secondary malignant neoplasm of other specified sites: Secondary | ICD-10-CM | POA: Diagnosis present

## 2017-08-09 DIAGNOSIS — F17200 Nicotine dependence, unspecified, uncomplicated: Secondary | ICD-10-CM | POA: Diagnosis present

## 2017-08-09 DIAGNOSIS — Z8673 Personal history of transient ischemic attack (TIA), and cerebral infarction without residual deficits: Secondary | ICD-10-CM

## 2017-08-09 DIAGNOSIS — G9341 Metabolic encephalopathy: Secondary | ICD-10-CM

## 2017-08-09 DIAGNOSIS — I10 Essential (primary) hypertension: Secondary | ICD-10-CM | POA: Diagnosis present

## 2017-08-09 DIAGNOSIS — C801 Malignant (primary) neoplasm, unspecified: Secondary | ICD-10-CM

## 2017-08-09 DIAGNOSIS — G936 Cerebral edema: Secondary | ICD-10-CM | POA: Diagnosis present

## 2017-08-09 DIAGNOSIS — R4182 Altered mental status, unspecified: Secondary | ICD-10-CM

## 2017-08-09 DIAGNOSIS — R6520 Severe sepsis without septic shock: Secondary | ICD-10-CM | POA: Diagnosis present

## 2017-08-09 DIAGNOSIS — J984 Other disorders of lung: Secondary | ICD-10-CM

## 2017-08-09 DIAGNOSIS — R1312 Dysphagia, oropharyngeal phase: Secondary | ICD-10-CM | POA: Diagnosis present

## 2017-08-09 DIAGNOSIS — J159 Unspecified bacterial pneumonia: Secondary | ICD-10-CM | POA: Diagnosis present

## 2017-08-09 DIAGNOSIS — E43 Unspecified severe protein-calorie malnutrition: Secondary | ICD-10-CM | POA: Diagnosis present

## 2017-08-09 DIAGNOSIS — R296 Repeated falls: Secondary | ICD-10-CM | POA: Diagnosis present

## 2017-08-09 DIAGNOSIS — R590 Localized enlarged lymph nodes: Secondary | ICD-10-CM | POA: Diagnosis present

## 2017-08-09 LAB — COMPREHENSIVE METABOLIC PANEL
ALT: 53 U/L (ref 17–63)
ANION GAP: 11 (ref 5–15)
AST: 116 U/L — ABNORMAL HIGH (ref 15–41)
Albumin: 2.9 g/dL — ABNORMAL LOW (ref 3.5–5.0)
Alkaline Phosphatase: 103 U/L (ref 38–126)
BUN: 7 mg/dL (ref 6–20)
CHLORIDE: 98 mmol/L — AB (ref 101–111)
CO2: 22 mmol/L (ref 22–32)
CREATININE: 0.73 mg/dL (ref 0.61–1.24)
Calcium: 9.5 mg/dL (ref 8.9–10.3)
Glucose, Bld: 119 mg/dL — ABNORMAL HIGH (ref 65–99)
POTASSIUM: 5.6 mmol/L — AB (ref 3.5–5.1)
SODIUM: 131 mmol/L — AB (ref 135–145)
Total Bilirubin: 1.7 mg/dL — ABNORMAL HIGH (ref 0.3–1.2)
Total Protein: 6.9 g/dL (ref 6.5–8.1)

## 2017-08-09 LAB — I-STAT VENOUS BLOOD GAS, ED
Acid-base deficit: 4 mmol/L — ABNORMAL HIGH (ref 0.0–2.0)
Bicarbonate: 22.4 mmol/L (ref 20.0–28.0)
O2 Saturation: 36 %
PH VEN: 7.299 (ref 7.250–7.430)
PO2 VEN: 24 mmHg — AB (ref 32.0–45.0)
TCO2: 24 mmol/L (ref 22–32)
pCO2, Ven: 45.6 mmHg (ref 44.0–60.0)

## 2017-08-09 LAB — CBC WITH DIFFERENTIAL/PLATELET
Basophils Absolute: 0 10*3/uL (ref 0.0–0.1)
Basophils Relative: 0 %
EOS ABS: 0 10*3/uL (ref 0.0–0.7)
Eosinophils Relative: 0 %
HEMATOCRIT: 41.6 % (ref 39.0–52.0)
HEMOGLOBIN: 14.1 g/dL (ref 13.0–17.0)
LYMPHS ABS: 1.8 10*3/uL (ref 0.7–4.0)
Lymphocytes Relative: 20 %
MCH: 32.9 pg (ref 26.0–34.0)
MCHC: 33.9 g/dL (ref 30.0–36.0)
MCV: 97.2 fL (ref 78.0–100.0)
Monocytes Absolute: 1.1 10*3/uL — ABNORMAL HIGH (ref 0.1–1.0)
Monocytes Relative: 12 %
NEUTROS ABS: 6.3 10*3/uL (ref 1.7–7.7)
NEUTROS PCT: 68 %
Platelets: 215 10*3/uL (ref 150–400)
RBC: 4.28 MIL/uL (ref 4.22–5.81)
RDW: 13.1 % (ref 11.5–15.5)
WBC: 9.3 10*3/uL (ref 4.0–10.5)

## 2017-08-09 LAB — URINALYSIS, ROUTINE W REFLEX MICROSCOPIC
Bilirubin Urine: NEGATIVE
Glucose, UA: NEGATIVE mg/dL
HGB URINE DIPSTICK: NEGATIVE
Ketones, ur: NEGATIVE mg/dL
Leukocytes, UA: NEGATIVE
NITRITE: NEGATIVE
Protein, ur: NEGATIVE mg/dL
Specific Gravity, Urine: 1.018 (ref 1.005–1.030)
pH: 5 (ref 5.0–8.0)

## 2017-08-09 LAB — I-STAT CG4 LACTIC ACID, ED
LACTIC ACID, VENOUS: 2.95 mmol/L — AB (ref 0.5–1.9)
Lactic Acid, Venous: 2.77 mmol/L (ref 0.5–1.9)

## 2017-08-09 MED ORDER — IOPAMIDOL (ISOVUE-370) INJECTION 76%
100.0000 mL | Freq: Once | INTRAVENOUS | Status: AC | PRN
  Administered 2017-08-09: 100 mL via INTRAVENOUS

## 2017-08-09 MED ORDER — SODIUM CHLORIDE 0.9 % IV SOLN
500.0000 mg | INTRAVENOUS | Status: DC
  Administered 2017-08-09 – 2017-08-14 (×6): 500 mg via INTRAVENOUS
  Filled 2017-08-09 (×7): qty 500

## 2017-08-09 MED ORDER — SODIUM CHLORIDE 0.9 % IV BOLUS (SEPSIS)
1000.0000 mL | Freq: Once | INTRAVENOUS | Status: AC
  Administered 2017-08-09: 1000 mL via INTRAVENOUS

## 2017-08-09 MED ORDER — IPRATROPIUM-ALBUTEROL 0.5-2.5 (3) MG/3ML IN SOLN
3.0000 mL | Freq: Once | RESPIRATORY_TRACT | Status: DC
Start: 2017-08-09 — End: 2017-08-10

## 2017-08-09 MED ORDER — SODIUM CHLORIDE 0.9 % IV SOLN
2.0000 g | INTRAVENOUS | Status: DC
  Administered 2017-08-09 – 2017-08-17 (×9): 2 g via INTRAVENOUS
  Filled 2017-08-09 (×5): qty 2
  Filled 2017-08-09: qty 20
  Filled 2017-08-09 (×3): qty 2

## 2017-08-09 MED ORDER — SODIUM CHLORIDE 0.9 % IV BOLUS (SEPSIS)
500.0000 mL | Freq: Once | INTRAVENOUS | Status: AC
  Administered 2017-08-09: 500 mL via INTRAVENOUS

## 2017-08-09 NOTE — ED Notes (Signed)
Patient transported to CT 

## 2017-08-09 NOTE — ED Triage Notes (Signed)
Per EMS: pt's wife called EMS for pt not feeling well and sob. Per wife, pt retired a month ago and has decreased PO intake, generalized weakness, and failure to thrive since retirement. Pt c/o cough and L knee pain; wheezing and rhonchi noted. Reports nonproductive cough.

## 2017-08-09 NOTE — ED Provider Notes (Addendum)
[facility] EMERGENCY DEPARTMENT Provider Note   CSN: [id number] Arrival date & time: 08/09/17  1916     History   Chief Complaint Chief Complaint  Patient presents with  . Weakness  . Shortness of Breath    HPI Kirk Carson is a 67 y.o. male presenting for evaluation of confusion, shortness of breath, and cough.  Level 5 caveat, patient is altered.  Majority of history provided by wife and daughter.  Family states patient started to act abnormally 2 months ago.  He has been more tired than normal, is not talking like normal.  He is walking with a started gait, appearing like he is concerned he is about to fall over.  Over the past month, patient has had increased shortness of breath and worsening cough.  They deny fevers, chills, vomiting, complains of abdominal pain, abnormal urination, abnormal bowel movements.  Patient denies pain currently.  He smokes cigarettes daily, denies alcohol or drug use.  History of stroke in 2015, was not started on any medications.  No residual neuro deficits at that time. Family states patient has no medical problems, does not take medications daily.   HPI  Past Medical History:  Diagnosis Date  . 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 History of TIA (transient ischemic attack)   . Hyperlipidemia   . Hypertension   . Iron deficiency anemia   . Stroke [facility])    TIA 08/2013  . Tobacco abuse   . Unspecified constipation     Patient Active Problem List   Diagnosis Date Noted  . CVA (cerebral vascular accident) ([place]) 10/26/2013  . History of TIA (transient ischemic attack) 08/31/2013  . Tobacco use disorder 08/31/2013  . TIA (transient ischemic attack) 08/23/2013  . Hypertension 08/23/2013    Past Surgical History:  Procedure Laterality Date  . TEE WITHOUT CARDIOVERSION N/A 08/24/2013   Procedure: TRANSESOPHAGEAL ECHOCARDIOGRAM (TEE);  Surgeon: Josue Hector, MD;  Location: [facility] ENDOSCOPY;   Service: Cardiovascular;  Laterality: N/A;        Home Medications    Prior to Admission medications   Medication Sig Start Date End Date Taking? Authorizing Provider  acetaminophen (TYLENOL) 325 MG tablet Take 650 mg by mouth every 6 (six) hours as needed for mild pain.   Yes [provider]    Family History Family History  Problem Relation Age of Onset  . Birth defects Mother   . Colon cancer Neg Hx     Social History Social History   Tobacco Use  . Smoking status: Current Every Day Smoker    Packs/day: 1.00    Types: Cigarettes  . Smokeless tobacco: Never Used  Substance Use Topics  . Alcohol use: Yes    Comment: occasionally  . Drug use: No     Allergies   Asa [aspirin]   Review of Systems Review of Systems  Unable to perform ROS: Mental status change  Constitutional: Positive for activity change and fatigue.  Respiratory: Positive for cough and shortness of breath.   Neurological: Positive for weakness.     Physical Exam Updated Vital Signs BP 128/84   Pulse 92   Temp 98.4 F (36.9 C) (Rectal)   Resp (!) 24   SpO2 96%   Physical Exam  Constitutional: He appears well-developed and well-nourished. No distress.  Chronically and acutely ill-appearing male.  HENT:  Head: Normocephalic and atraumatic.  MM dry  Eyes: Pupils are equal, round, and reactive to  light. Conjunctivae and EOM are normal.  Neck: Normal range of motion. Neck supple.  Cardiovascular: Regular rhythm and intact distal pulses.  tachycardic  Pulmonary/Chest: Accessory muscle usage present. Tachypnea noted. No respiratory distress. He has wheezes. He has rhonchi.  Taking a breath every few words.  Tachypneic with accessory muscle use.  Wheezing and rhonchi heard in all fields.  Clubbing noted of the finger nails  Abdominal: Soft. He exhibits no distension and no mass. There is no tenderness. There is no guarding.  Musculoskeletal: Normal range of motion.  Radial and  pedal pulses intact. No leg pain or swelling  Neurological: No sensory deficit. GCS eye subscore is 4. GCS verbal subscore is 4. GCS motor subscore is 6.  Pt confused. Answers are hard to understand, and not always appropriate. Will follow commands.   Skin: Skin is warm and dry.  Psychiatric: He has a normal mood and affect.  Nursing note and vitals reviewed.    ED Treatments / Results  Labs (all labs ordered are listed, but only abnormal results are displayed) Labs Reviewed  COMPREHENSIVE METABOLIC PANEL - Abnormal; Notable for the following components:      Result Value   Sodium 131 (*)    Potassium 5.6 (*)    Chloride 98 (*)    Glucose, Bld 119 (*)    Albumin 2.9 (*)    AST 116 (*)    Total Bilirubin 1.7 (*)    All other components within normal limits  CBC WITH DIFFERENTIAL/PLATELET - Abnormal; Notable for the following components:   Monocytes Absolute 1.1 (*)    All other components within normal limits  I-STAT CG4 LACTIC ACID, ED - Abnormal; Notable for the following components:   Lactic Acid, Venous 2.95 (*)    All other components within normal limits  I-STAT VENOUS BLOOD GAS, ED - Abnormal; Notable for the following components:   pO2, Ven 24.0 (*)    Acid-base deficit 4.0 (*)    All other components within normal limits  I-STAT CG4 LACTIC ACID, ED - Abnormal; Notable for the following components:   Lactic Acid, Venous 2.77 (*)    All other components within normal limits  CULTURE, BLOOD (ROUTINE X 2)  CULTURE, BLOOD (ROUTINE X 2)  URINALYSIS, ROUTINE W REFLEX MICROSCOPIC    EKG EKG Interpretation  Date/Time:  Sunday August 09 2017 20:49:35 EDT Ventricular Rate:  102 PR Interval:    QRS Duration: 69 QT Interval:  332 QTC Calculation: 433 R Axis:   0 Text Interpretation:  Sinus or ectopic atrial tachycardia Borderline low voltage, extremity leads Nonspecific T abnormalities, lateral leads Baseline wander Abnormal ekg Confirmed by Carmin Muskrat [phone]) on  08/09/2017 9:54:59 PM   Radiology Dg Chest 2 View  Result Date: 08/09/2017 CLINICAL DATA:  Cough, wheezing, and shortness of breath. EXAM: CHEST - 2 VIEW COMPARISON:  Chest x-ray dated Aug 23, 2013. FINDINGS: The heart size and mediastinal contours are within normal limits. Normal pulmonary vascularity. Consolidation within the right upper lobe. Coarsened interstitial markings in the peripheral mid to lower lungs is slightly progressed when compared to prior study. No pleural effusion or pneumothorax. No acute osseous abnormality. IMPRESSION: 1. Right upper lobe pneumonia. Followup PA and lateral chest X-ray is recommended in 3-4 weeks following trial of antibiotic therapy to ensure resolution and exclude underlying malignancy. 2. Chronic interstitial lung disease, slightly progressed. Electronically Signed   By: Titus Dubin M.D.   On: 08/09/2017 19:55   Ct Head Wo Contrast  Result Date: 08/09/2017 CLINICAL DATA:  Initial evaluation for acute ataxia, stroke suspected. EXAM: CT HEAD WITHOUT CONTRAST TECHNIQUE: Contiguous axial images were obtained from the base of the skull through the vertex without intravenous contrast. COMPARISON:  Prior MRI from 08/23/2013. FINDINGS: Brain: Study mildly degraded by motion artifact. Multiple hyperdense mass lesions are seen scattered throughout the bilateral cerebral hemispheres, consistent with metastatic disease. For reference purposes, largest lesion on the left seen air the left frontoparietal vertex and measures 13 mm (series 3, image 26). Largest lesion on the right position within the right parieto-occipital region and measures approximately 2.5 cm (series 5, image 54). Largest lesion on the left position within the left temporoparietal region and measures approximately 2.4 cm. 6 mm lesion noted at the dorsal left midbrain (series 3, image 12). 10 mm left cerebellar lesion (series 3, image 6). Lesions are hyperdense in appearance, and may be partially  hemorrhagic in nature. Localized vasogenic edema about several of the lesions without midline shift or significant mass effect. Underlying cerebral atrophy with mild chronic small vessel ischemic disease. No acute large vessel territory infarct. No definite extra-axial collection on this motion degraded exam. Ventricles normal in size without hydrocephalus. Crowding at the basilar cisterns without frank tentorial herniation. Vascular: No appreciable hyperdense vessel. Skull: Scalp soft tissues within normal limits. Calvarium intact. No focal osseous lesions. Sinuses/Orbits: Globes and orbital soft tissues within normal limits. Paranasal sinuses are clear. Changes related chronic maxillary sinusitis noted. No mastoid effusion. Other: None. IMPRESSION: Innumerable hyperdense lesions scattered throughout the supratentorial and infratentorial brain, consistent with metastatic disease. Localized vasogenic edema about several of the lesions without significant mass effect or midline shift. Several of these lesions are hyperdense in appearance, and may be partially hemorrhagic. Further evaluation with dedicated brain MRI and metastatic workup recommended. Electronically Signed   By: Jeannine Boga M.D.   On: 08/09/2017 22:21    Procedures .Critical Care Performed by: Franchot Heidelberg, PA-C Authorized by: Franchot Heidelberg, PA-C   Critical care provider statement:    Critical care time (minutes):  40   Critical care time was exclusive of:  Separately billable procedures and treating other patients and teaching time   Critical care was necessary to treat or prevent imminent or life-threatening deterioration of the following conditions:  Sepsis and CNS failure or compromise   Critical care was time spent personally by me on the following activities:  Blood draw for specimens, development of treatment plan with patient or surrogate, discussions with consultants, evaluation of patient's response to treatment,  examination of patient, obtaining history from patient or surrogate, ordering and performing treatments and interventions, ordering and review of laboratory studies, ordering and review of radiographic studies, re-evaluation of patient's condition, pulse oximetry and review of old charts   I assumed direction of critical care for this patient from another provider in my specialty: no   Comments:     Pt critically ill, meets sepsis criteria. 1 IV abx started. 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   (including critical care time)  Medications Ordered in ED Medications  cefTRIAXone (ROCEPHIN) 2 g in sodium chloride 0.9 % 100 mL IVPB (0 g Intravenous Stopped 08/09/17 2141)  azithromycin (ZITHROMAX) 500 mg in sodium chloride 0.9 % 250 mL IVPB (0 mg Intravenous Stopped 08/09/17 2211)  ipratropium-albuterol (DUONEB) 0.5-2.5 (3) MG/3ML nebulizer solution 3 mL (has no administration in time range)  sodium chloride 0.9 % bolus 1,000 mL (0 mLs Intravenous Stopped 08/09/17 2142)    And  sodium  chloride 0.9 % bolus 1,000 mL (0 mLs Intravenous Stopped 08/09/17 2142)    And  sodium chloride 0.9 % bolus 500 mL (0 mLs Intravenous Stopped 08/09/17 2240)  iopamidol (ISOVUE-370) 76 % injection 100 mL (100 mLs Intravenous Contrast Given 08/09/17 2307)     Initial Impression / Assessment and Plan / ED Course  I have reviewed the triage vital signs and the nursing notes.  Pertinent labs & imaging results that were available during my care of the patient were reviewed by me and considered in my medical decision making (see chart for details).     Pt presenting for evaluation of weakness and shortness of breath.  Physical exam concerning, patient appears ill and altered.  Poor pulmonary exam, patient is tachypneic and with junky lung sounds.  As he is tachypneic and tachycardic, and chest x-ray with right upper lobe pneumonia, will call code sepsis. Labs show hyperkalemia, elevated AST, and dehydration. Lactic elevated at 2.9.  Will order VBG, UA, CT head, and CTA.  Fluids and antibiotics initiated.  Discussed with attending, Dr. Vanita Panda evaluated the patient.  CT head concerning for brain mets.  Recommending MRI for further evaluation.  Discussed findings with patient and daughter.  Will admit to the hospitalist for PNA and f/u scans. PE study pending.   Discussed with Dr. Stana Bunting from [facility], pt to be admitted to hospitalist service.   Final Clinical Impressions(s) / ED Diagnoses   Final diagnoses:  Pneumonia of right upper lobe due to infectious organism [facility])  Altered mental status, unspecified altered mental status type  Cancer [facility]    ED Discharge Orders    None       Franchot Heidelberg, PA-C 08/10/17 0002    Franchot Heidelberg, PA-C 08/10/17 0048    Carmin Muskrat, MD 08/10/17 1806

## 2017-08-09 NOTE — ED Notes (Signed)
2L NS started in triage

## 2017-08-10 ENCOUNTER — Encounter (HOSPITAL_COMMUNITY): Payer: Self-pay | Admitting: Radiation Oncology

## 2017-08-10 ENCOUNTER — Other Ambulatory Visit: Payer: Self-pay

## 2017-08-10 ENCOUNTER — Ambulatory Visit
Admit: 2017-08-10 | Discharge: 2017-08-10 | Disposition: A | Discharge status: Home / Self Care | Payer: Self-pay | Attending: Radiation Oncology | Admitting: Radiation Oncology

## 2017-08-10 ENCOUNTER — Inpatient Hospital Stay (HOSPITAL_COMMUNITY): Payer: Self-pay

## 2017-08-10 DIAGNOSIS — Z8673 Personal history of transient ischemic attack (TIA), and cerebral infarction without residual deficits: Secondary | ICD-10-CM

## 2017-08-10 DIAGNOSIS — J984 Other disorders of lung: Secondary | ICD-10-CM | POA: Insufficient documentation

## 2017-08-10 DIAGNOSIS — C7931 Secondary malignant neoplasm of brain: Secondary | ICD-10-CM

## 2017-08-10 DIAGNOSIS — A419 Sepsis, unspecified organism: Principal | ICD-10-CM

## 2017-08-10 DIAGNOSIS — R6520 Severe sepsis without septic shock: Secondary | ICD-10-CM

## 2017-08-10 DIAGNOSIS — F17210 Nicotine dependence, cigarettes, uncomplicated: Secondary | ICD-10-CM

## 2017-08-10 DIAGNOSIS — C3411 Malignant neoplasm of upper lobe, right bronchus or lung: Secondary | ICD-10-CM

## 2017-08-10 DIAGNOSIS — C801 Malignant (primary) neoplasm, unspecified: Secondary | ICD-10-CM

## 2017-08-10 DIAGNOSIS — Z7901 Long term (current) use of anticoagulants: Secondary | ICD-10-CM

## 2017-08-10 DIAGNOSIS — R918 Other nonspecific abnormal finding of lung field: Secondary | ICD-10-CM

## 2017-08-10 DIAGNOSIS — R590 Localized enlarged lymph nodes: Secondary | ICD-10-CM

## 2017-08-10 LAB — CBC
HCT: 40.9 % (ref 39.0–52.0)
Hemoglobin: 13.9 g/dL (ref 13.0–17.0)
MCH: 33.2 pg (ref 26.0–34.0)
MCHC: 34 g/dL (ref 30.0–36.0)
MCV: 97.6 fL (ref 78.0–100.0)
PLATELETS: 262 10*3/uL (ref 150–400)
RBC: 4.19 MIL/uL — ABNORMAL LOW (ref 4.22–5.81)
RDW: 13.1 % (ref 11.5–15.5)
WBC: 9.1 10*3/uL (ref 4.0–10.5)

## 2017-08-10 LAB — COMPREHENSIVE METABOLIC PANEL
ALT: 35 U/L (ref 17–63)
ANION GAP: 12 (ref 5–15)
AST: 73 U/L — AB (ref 15–41)
Albumin: 3 g/dL — ABNORMAL LOW (ref 3.5–5.0)
Alkaline Phosphatase: 92 U/L (ref 38–126)
BUN: 8 mg/dL (ref 6–20)
CHLORIDE: 104 mmol/L (ref 101–111)
CO2: 21 mmol/L — ABNORMAL LOW (ref 22–32)
Calcium: 9.6 mg/dL (ref 8.9–10.3)
Creatinine, Ser: 0.77 mg/dL (ref 0.61–1.24)
Glucose, Bld: 115 mg/dL — ABNORMAL HIGH (ref 65–99)
POTASSIUM: 4.3 mmol/L (ref 3.5–5.1)
Sodium: 137 mmol/L (ref 135–145)
TOTAL PROTEIN: 7.1 g/dL (ref 6.5–8.1)
Total Bilirubin: 0.7 mg/dL (ref 0.3–1.2)

## 2017-08-10 LAB — PROCALCITONIN: PROCALCITONIN: 0.14 ng/mL

## 2017-08-10 LAB — LACTIC ACID, PLASMA: Lactic Acid, Venous: 2.7 mmol/L (ref 0.5–1.9)

## 2017-08-10 LAB — LACTATE DEHYDROGENASE: LDH: 1024 U/L — ABNORMAL HIGH (ref 98–192)

## 2017-08-10 MED ORDER — GADOBENATE DIMEGLUMINE 529 MG/ML IV SOLN: 16.0000 mL | Freq: Once | INTRAVENOUS | Status: DC | PRN

## 2017-08-10 MED ORDER — SENNOSIDES-DOCUSATE SODIUM 8.6-50 MG PO TABS
1.0000 | ORAL_TABLET | Freq: Every day | ORAL | Status: DC
  Administered 2017-08-11 – 2017-08-20 (×7): 1 via ORAL
  Filled 2017-08-10 (×8): qty 1

## 2017-08-10 MED ORDER — IPRATROPIUM-ALBUTEROL 0.5-2.5 (3) MG/3ML IN SOLN
3.0000 mL | Freq: Four times a day (QID) | RESPIRATORY_TRACT | Status: DC
  Administered 2017-08-10 (×2): 3 mL via RESPIRATORY_TRACT
  Filled 2017-08-10 (×3): qty 3

## 2017-08-10 MED ORDER — DEXAMETHASONE SODIUM PHOSPHATE 4 MG/ML IJ SOLN
4.0000 mg | Freq: Four times a day (QID) | INTRAMUSCULAR | Status: DC
  Administered 2017-08-10 – 2017-08-19 (×35): 4 mg via INTRAVENOUS
  Filled 2017-08-10 (×35): qty 1

## 2017-08-10 MED ORDER — SENNOSIDES-DOCUSATE SODIUM 8.6-50 MG PO TABS: 2.0000 | ORAL_TABLET | Freq: Two times a day (BID) | ORAL | Status: DC | PRN

## 2017-08-10 MED ORDER — NICOTINE 21 MG/24HR TD PT24
21.0000 mg | MEDICATED_PATCH | Freq: Once | TRANSDERMAL | Status: DC
  Administered 2017-08-10: 21 mg via TRANSDERMAL
  Filled 2017-08-10: qty 1

## 2017-08-10 MED ORDER — ACETAMINOPHEN 325 MG PO TABS
650.0000 mg | ORAL_TABLET | ORAL | Status: DC | PRN
  Administered 2017-08-10 – 2017-08-17 (×4): 650 mg via ORAL
  Filled 2017-08-10 (×4): qty 2

## 2017-08-10 MED ORDER — GUAIFENESIN-DM 100-10 MG/5ML PO SYRP
10.0000 mL | ORAL_SOLUTION | ORAL | Status: DC | PRN
Start: 2017-08-10 — End: 2017-08-21
  Administered 2017-08-14 – 2017-08-15 (×2): 10 mL via ORAL
  Filled 2017-08-10 (×2): qty 10

## 2017-08-10 MED ORDER — DEXAMETHASONE SODIUM PHOSPHATE 4 MG/ML IJ SOLN
4.0000 mg | Freq: Four times a day (QID) | INTRAMUSCULAR | Status: DC
  Administered 2017-08-10: 4 mg via INTRAVENOUS
  Filled 2017-08-10 (×2): qty 1

## 2017-08-10 MED ORDER — GUAIFENESIN ER 600 MG PO TB12
600.0000 mg | ORAL_TABLET | Freq: Two times a day (BID) | ORAL | Status: DC
  Administered 2017-08-11 – 2017-08-14 (×6): 600 mg via ORAL
  Filled 2017-08-10 (×10): qty 1

## 2017-08-10 MED ORDER — IPRATROPIUM-ALBUTEROL 0.5-2.5 (3) MG/3ML IN SOLN
3.0000 mL | Freq: Four times a day (QID) | RESPIRATORY_TRACT | Status: DC
  Administered 2017-08-11 (×2): 3 mL via RESPIRATORY_TRACT
  Filled 2017-08-10 (×2): qty 3

## 2017-08-10 MED ORDER — NICOTINE 14 MG/24HR TD PT24: 14.0000 mg | MEDICATED_PATCH | Freq: Every day | TRANSDERMAL | Status: DC

## 2017-08-10 MED ORDER — ONDANSETRON HCL 4 MG PO TABS: 4.0000 mg | ORAL_TABLET | Freq: Three times a day (TID) | ORAL | Status: DC | PRN

## 2017-08-10 MED ORDER — DEXAMETHASONE SODIUM PHOSPHATE 4 MG/ML IJ SOLN
10.0000 mg | Freq: Once | INTRAMUSCULAR | Status: AC
  Administered 2017-08-10: 10 mg via INTRAVENOUS
  Filled 2017-08-10: qty 3

## 2017-08-10 MED ORDER — PANTOPRAZOLE SODIUM 40 MG PO TBEC
40.0000 mg | DELAYED_RELEASE_TABLET | Freq: Every day | ORAL | Status: DC
  Administered 2017-08-10 – 2017-08-21 (×11): 40 mg via ORAL
  Filled 2017-08-10 (×14): qty 1

## 2017-08-10 MED ORDER — NICOTINE 21 MG/24HR TD PT24
21.0000 mg | MEDICATED_PATCH | Freq: Every day | TRANSDERMAL | Status: DC
  Administered 2017-08-10 – 2017-08-20 (×11): 21 mg via TRANSDERMAL
  Filled 2017-08-10 (×12): qty 1

## 2017-08-10 MED ORDER — ZOLPIDEM TARTRATE 5 MG PO TABS
5.0000 mg | ORAL_TABLET | Freq: Every evening | ORAL | Status: DC | PRN
  Administered 2017-08-13 – 2017-08-15 (×2): 5 mg via ORAL
  Filled 2017-08-10 (×2): qty 1

## 2017-08-10 MED ORDER — ALUM & MAG HYDROXIDE-SIMETH 200-200-20 MG/5ML PO SUSP: 60.0000 mL | ORAL | Status: DC | PRN

## 2017-08-10 MED ORDER — SODIUM CHLORIDE 0.9 % IV SOLN
8.0000 mg | Freq: Three times a day (TID) | INTRAVENOUS | Status: DC | PRN
  Filled 2017-08-10: qty 4

## 2017-08-10 MED ORDER — SODIUM CHLORIDE 0.9% FLUSH
3.0000 mL | Freq: Two times a day (BID) | INTRAVENOUS | Status: DC
  Administered 2017-08-10 – 2017-08-21 (×13): 3 mL via INTRAVENOUS

## 2017-08-10 MED ORDER — ONDANSETRON 4 MG PO TBDP: 4.0000 mg | ORAL_TABLET | Freq: Three times a day (TID) | ORAL | Status: DC | PRN

## 2017-08-10 MED ORDER — ONDANSETRON HCL 4 MG/2ML IJ SOLN: 4.0000 mg | Freq: Three times a day (TID) | INTRAMUSCULAR | Status: DC | PRN

## 2017-08-10 NOTE — Progress Notes (Addendum)
[facility] Progress Note  Patient: Kirk Carson [id number]   PCP: Patient, No Pcp Per DOB: 06-14-1950   DOA: 08/09/2017   DOS: 08/10/2017   Date of Service: the patient was seen and examined on 08/10/2017  Subjective: Patient is confused although denies any acute complaint.  Initially had some headache and nausea currently resolved.  Denies any shortness of breath but has audible wheezing.  Also has cough.  No fever no chills.  To fall prior to admission the day before.  Brief hospital course: Pt. 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 Currently further plan is continue IV antibiotics and further work-up for the lung mass.  Assessment and Plan: 1.  Community-acquired pneumonia. Possibility of postobstructive pneumonia cannot be ruled out. Currently on IV ceftriaxone and azithromycin. Follow-up on blood culture. Follow-up on sputum culture. Duo nebs as well as Mucinex as well as a flutter device.  2.  Primary lung mass. 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 Pulmonary consulted for biopsy and bronchoscopy, given the presence of multiple brain metastasis with hemorrhagic conversion they felt that IR guided biopsy of the chest nodule is a better option. IR consulted and patient will undergo biopsy of the seed location. Hematoma oncology, neuro oncology, radiation oncology consulted as well. Patient will undergo simulation for radiation tomorrow but ideally they would like to hold for improvement in edema with the help of IV steroids before proceeding with radiation to avoid further inflammatory CNS changes.  3.  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 Patient's condition is critical given that patient has significant brain metastasis with surrounding vasogenic edema as well as crowding of the fourth ventricle and foramen  magnum. Currently the patient does not have any signs of impending herniation. Will perform neuro checks every 4 hours. As above radiation oncology, neuro oncology consulted. Continue IV Decadron for now. Anticipate improvement in 24 to 48 hours.  4.  Acute encephalopathy. In the setting of brain metastasis. Continue IV steroids. Speech therapy consult for swallowing. Avoid psychotropic medications. Monitor.  Diet: NPO penidng speech DVT Prophylaxis: mechanical compression device  Advance goals of care discussion: full code  Family Communication: family was present at bedside, at the time of interview. The pt provided permission to discuss medical plan with the family. Opportunity was given to ask question and all questions were answered satisfactorily.   Disposition:  Discharge to be determined.  Consultants: Pulmonary, interventional radiology, radiation oncology, neuro-oncology, hematology oncology Procedures: none  Antibiotics: Anti-infectives (From admission, onward)   Start     Dose/Rate Route Frequency Ordered Stop   08/09/17 2100  cefTRIAXone (ROCEPHIN) 2 g in sodium chloride 0.9 % 100 mL IVPB     2 g 200 mL/hr over 30 Minutes Intravenous Every 24 hours 08/09/17 2052     08/09/17 2100  azithromycin (ZITHROMAX) 500 mg in sodium chloride 0.9 % 250 mL IVPB     500 mg 250 mL/hr over 60 Minutes Intravenous Every 24 hours 08/09/17 2052         Objective: Physical Exam: Vitals:   08/09/17 2230 08/10/17 0239 08/10/17 1322 08/10/17 1508  BP: 128/84 (!) 130/94  110/76  Pulse: 92 (!) 107  83  Resp: (!) 24 18  17   Temp:  97.6 F (36.4 C)  97.8 F (36.6 C)  TempSrc:  Oral  Oral  SpO2: 96% 100% 97% 100%  Intake/Output Summary (Last 24 hours) at 08/10/2017 1758 Last data filed at 08/10/2017 1251 Gross per 24 hour  Intake 2850 ml  Output 240 ml  Net 2610 ml   There were no vitals filed for this visit. General: Alert, Awake and Oriented to Time, Place and Person.  Appear in moderate distress, affect appropriate Eyes: PERRL, Conjunctiva normal ENT: Oral Mucosa clear moist. Neck: no JVD, no Abnormal Mass Or lumps Cardiovascular: S1 and S2 Present, no Murmur, Peripheral Pulses Present Respiratory: normal respiratory effort, Bilateral Air entry equal and Decreased, no use of accessory muscle, bilateral Crackles, bilateral wheezes Abdomen: Bowel Sound present, Soft and no tenderness, no hernia Skin: no redness, no Rash, no induration Extremities: no Pedal edema, no calf tenderness Neurologic: Grossly no focal neuro deficit. Bilaterally Equal motor strength  Data Reviewed: CBC: Recent Labs  Lab 08/09/17 1935 08/10/17 1129  WBC 9.3 9.1  NEUTROABS 6.3  --   HGB 14.1 13.9  HCT 41.6 40.9  MCV 97.2 97.6  PLT 215 572   Basic Metabolic Panel: Recent Labs  Lab 08/09/17 1935 08/10/17 1129  NA 131* 137  K 5.6* 4.3  CL 98* 104  CO2 22 21*  GLUCOSE 119* 115*  BUN 7 8  CREATININE 0.73 0.77  CALCIUM 9.5 9.6    Liver Function Tests: Recent Labs  Lab 08/09/17 1935 08/10/17 1129  AST 116* 73*  ALT 53 35  ALKPHOS 103 92  BILITOT 1.7* 0.7  PROT 6.9 7.1  ALBUMIN 2.9* 3.0*   No results for input(s): LIPASE, AMYLASE in the last 168 hours. No results for input(s): AMMONIA in the last 168 hours. Coagulation Profile: No results for input(s): INR, PROTIME in the last 168 hours. Cardiac Enzymes: No results for input(s): CKTOTAL, CKMB, CKMBINDEX, TROPONINI in the last 168 hours. BNP (last 3 results) No results for input(s): PROBNP in the last 8760 hours. CBG: No results for input(s): GLUCAP in the last 168 hours. Studies: Dg Chest 2 View  Result Date: 08/09/2017 CLINICAL DATA:  Cough, wheezing, and shortness of breath. EXAM: CHEST - 2 VIEW COMPARISON:  Chest x-ray dated Aug 23, 2013. FINDINGS: The heart size and mediastinal contours are within normal limits. Normal pulmonary vascularity. Consolidation within the right upper lobe. Coarsened  interstitial markings in the peripheral mid to lower lungs is slightly progressed when compared to prior study. No pleural effusion or pneumothorax. No acute osseous abnormality. IMPRESSION: 1. Right upper lobe pneumonia. Followup PA and lateral chest X-ray is recommended in 3-4 weeks following trial of antibiotic therapy to ensure resolution and exclude underlying malignancy. 2. Chronic interstitial lung disease, slightly progressed. Electronically Signed   By: Titus Dubin M.D.   On: 08/09/2017 19:55   Ct Head Wo Contrast  Result Date: 08/09/2017 CLINICAL DATA:  Initial evaluation for acute ataxia, stroke suspected. EXAM: CT HEAD WITHOUT CONTRAST TECHNIQUE: Contiguous axial images were obtained from the base of the skull through the vertex without intravenous contrast. COMPARISON:  Prior MRI from 08/23/2013. FINDINGS: Brain: Study mildly degraded by motion artifact. Multiple hyperdense mass lesions are seen scattered throughout the bilateral cerebral hemispheres, consistent with metastatic disease. For reference purposes, largest lesion on the left seen air the left frontoparietal vertex and measures 13 mm (series 3, image 26). Largest lesion on the right position within the right parieto-occipital region and measures approximately 2.5 cm (series 5, image 54). Largest lesion on the left position within the left temporoparietal region and measures approximately 2.4 cm. 6 mm lesion noted at the  dorsal left midbrain (series 3, image 12). 10 mm left cerebellar lesion (series 3, image 6). Lesions are hyperdense in appearance, and may be partially hemorrhagic in nature. Localized vasogenic edema about several of the lesions without midline shift or significant mass effect. Underlying cerebral atrophy with mild chronic small vessel ischemic disease. No acute large vessel territory infarct. No definite extra-axial collection on this motion degraded exam. Ventricles normal in size without hydrocephalus. Crowding at  the basilar cisterns without frank tentorial herniation. Vascular: No appreciable hyperdense vessel. Skull: Scalp soft tissues within normal limits. Calvarium intact. No focal osseous lesions. Sinuses/Orbits: Globes and orbital soft tissues within normal limits. Paranasal sinuses are clear. Changes related chronic maxillary sinusitis noted. No mastoid effusion. Other: None. IMPRESSION: Innumerable hyperdense lesions scattered throughout the supratentorial and infratentorial brain, consistent with metastatic disease. Localized vasogenic edema about several of the lesions without significant mass effect or midline shift. Several of these lesions are hyperdense in appearance, and may be partially hemorrhagic. Further evaluation with dedicated brain MRI and metastatic workup recommended. Electronically Signed   By: Jeannine Boga M.D.   On: 08/09/2017 22:21   Ct Angio Chest Pe W/cm &/or Wo Cm  Result Date: 08/10/2017 CLINICAL DATA:  Dyspnea EXAM: CT ANGIOGRAPHY CHEST WITH CONTRAST TECHNIQUE: Multidetector CT imaging of the chest was performed using the standard protocol during bolus administration of intravenous contrast. Multiplanar CT image reconstructions and MIPs were obtained to evaluate the vascular anatomy. CONTRAST:  74 cc ISOVUE-370 IOPAMIDOL (ISOVUE-370) INJECTION 76% COMPARISON:  CXR 08/09/2017 FINDINGS: Cardiovascular: Heart is within normal limits for size. No pericardial effusion. Minimal left main and three-vessel coronary arteriosclerosis. No acute pulmonary embolus. The pulmonary vessels to the right upper lobe are attenuated in appearance secondary to large right paramediastinal and suprahilar masslike abnormality likely representing an enlarged lymph node mass, estimated at 5.3 x 7.3 x 7.8 cm. No aortic aneurysm or dissection. Mediastinum/Nodes: Mediastinal adenopathy measuring 1.7 cm short axis in the subcarinal station, 2.3 cm in right lower paratracheal station, 2.1 cm right upper  paratracheal station with bilateral hilar lymphadenopathy measuring up to 1.5 cm on the right and 1.2 cm short axis on the left. Confluent soft tissue masslike abnormality abutting the right superior mediastinum extending to the suprahilar portion of the mediastinum as above described likely represents a confluent lymph node mass attenuating the upper lobe pulmonary vessels and distal right mainstem bronchus. The thyroid gland is unremarkable. Midline trachea is identified. Luminal narrowing of the distal right mainstem bronchus secondary to a right paramediastinal/perihilar soft tissue mass is identified. Lungs/Pleura: Centrilobular and paraseptal emphysema with large confluent masslike abnormality centered within the right upper lobe measuring at least 8.9 x 7.7 x 5.5 cm. Small right effusion. Subpleural areas of interstitial prominence and fibrosis. No definite mass identified of the left lung. Upper Abdomen: Innumerable subcutaneous metastatic nodules scattered throughout the visualized anterior, right lateral and posterior chest wall. The largest is approximately 2.3 cm on series 5/31 in the right anterolateral chest wall. Musculoskeletal: Degenerative disc disease of the upper and midthoracic spine without aggressive osseous lesion noted. Review of the MIP images confirms the above findings. IMPRESSION: 1. Extensive centrilobular and paraseptal emphysema throughout both lungs. 2. An 8.9 x 7.7 x 5.5 cm pulmonary mass centered within the right upper lobe is identified with confluent right paramediastinal, mediastinal and bilateral hilar lymphadenopathy with scattered areas of postobstructive pulmonary consolidation as well. Small right effusion is noted. Suspect findings are related to primary bronchogenic carcinoma with metastatic lymphadenopathy in  the setting of COPD. Lymphoma may be within the differential though believed less likely. 3. Scattered metastatic soft tissue nodules noted of the chest wall, the  largest is anterolateral measuring 2.3 cm. 4. Coronary arteriosclerosis and aortic atherosclerosis. 5. No acute pulmonary embolus. Aortic Atherosclerosis (ICD10-I70.0) and Emphysema (ICD10-J43.9). Electronically Signed   By: Ashley Royalty M.D.   On: 08/10/2017 00:24   Mr Brain Wo Contrast  Result Date: 08/10/2017 CLINICAL DATA:  Initial evaluation for acute altered mental status, metastatic disease. EXAM: MRI HEAD WITHOUT CONTRAST TECHNIQUE: Multiplanar, multiecho pulse sequences of the brain and surrounding structures were obtained without intravenous contrast. COMPARISON:  Prior CT from earlier the same day. FINDINGS: Brain: Examination technically limited due to patient's inability to tolerate the full length of the exam. Additionally, images provided are degraded by motion artifact. Again seen are innumerable metastatic lesions scattered throughout the bilateral cerebral and cerebellar hemispheres, more discernible and evident as compared to previous CT. Supratentorially, these lesions are primarily centered along the gray-white matter differentiation. There is extensive involvement throughout the cerebellum, with innumerable bilateral cerebellar metastases susceptibility artifacts seen associated with the preponderance of these lesions, consistent with hemorrhagic metastases. Few scattered superimposed fluid-fluid levels noted with these lesions, consistent with layering hematocrit (series 7, image 16 at the right parietal lobe for example). For reference purposes, largest discrete lesion in the right cerebral hemisphere positioned at the posterior right parietal lobe and measures 3.4 x 2.0 cm (series 7, image 17). Probable invasion into the adjacent superior sagittal sinus noted (series 7, image 19). Largest discrete lesion within the left cerebellar hemisphere positioned at the high anterior left frontal lobe and measures approximately 1.3 x 2.2 cm. Within the cerebellum, largest discrete lesion measures  approximately 2.2 cm on the right (series 7, image 6), although exact measurements are difficult given the extensive tumor burden and lack of IV contrast. Prominent lesion at the right cerebellar peduncle noted as well. Probable metastasis at the optic chiasm (series 5, image 12). There is extensive vasogenic edema throughout the cerebellar hemisphere related to metastatic disease. Fourth ventricle is narrowed and attenuated but remains patent at this time. Crowding at the craniocervical junction with descent of the cerebellar tonsils of up to 6 mm through the foramen magnum. Basilar cisterns remain patent superiorly. No hydrocephalus or midline shift. No evidence for acute infarct.  No extra-axial fluid collection. Vascular: Major arterial vascular flow voids are maintained at the skull base. Tumor invasion of the posterior aspect of the superior sagittal sinus (series 7, image 19). Skull and upper cervical spine: Vasogenic edema within the cerebellum with associated crowding at the craniocervical junction. Upper cervical spine within normal limits. Bone marrow signal intensity within normal limits. No obvious discrete lesion identified on this limited motion degraded exam. No scalp soft tissue abnormality. Sinuses/Orbits: Globes and orbital soft tissues within normal limits. Paranasal sinuses are clear. No mastoid effusion. Inner ear structures grossly normal. Other: None. IMPRESSION: 1. Limited study due to patient's inability to tolerate the full length of the exam and motion artifact. 2. 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 Localized vasogenic edema about numerous lesions, with diffuse edema throughout the cerebellum and posterior fossa. Mass effect on the fourth ventricle which is narrowed and attenuated, with crowding at the foramen magnum. No hydrocephalus or frank herniation at this time. No midline  shift. 3. Tumor invasion of the posterior aspect of the superior sagittal sinus as above. Electronically Signed  By: Jeannine Boga M.D.   On: 08/10/2017 02:40    Scheduled Meds: . dexamethasone  4 mg Intravenous Q6H  . guaiFENesin  600 mg Oral BID  . ipratropium-albuterol  3 mL Nebulization Q6H  . nicotine  21 mg Transdermal Daily  . pantoprazole  40 mg Oral Daily  . senna-docusate  1 tablet Oral QHS  . sodium chloride flush  3 mL Intravenous Q12H   Continuous Infusions: . azithromycin Stopped (08/09/17 2211)  . cefTRIAXone (ROCEPHIN)  IV Stopped (08/09/17 2141)  . ondansetron (ZOFRAN) IV     PRN Meds: acetaminophen, alum & mag hydroxide-simeth, guaiFENesin-dextromethorphan, ondansetron **OR** ondansetron **OR** ondansetron (ZOFRAN) IV **OR** ondansetron (ZOFRAN) IV, senna-docusate, zolpidem  Time spent: 50 minutes,   Author: Berle Mull, MD [facility] Pager: [phone] 08/10/2017 5:58 PM  If 7PM-7AM, please contact night-coverage at [email or web address], password [facility]

## 2017-08-10 NOTE — Evaluation (Addendum)
Clinical/Bedside Swallow Evaluation Patient Details  Name: Kirk Carson MRN: [id number] Date of Birth: 01-23-1951  Today's Date: 08/10/2017 Time: SLP Start Time (ACUTE ONLY): 1330 SLP Stop Time (ACUTE ONLY): 1357 SLP Time Calculation (min) (ACUTE ONLY): 27 min  Past Medical History:  Past Medical History:  Diagnosis Date  . 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 History of TIA (transient ischemic attack)   . Hyperlipidemia   . Hypertension   . Iron deficiency anemia   . Stroke [facility])    TIA 08/2013  . Tobacco abuse   . Unspecified constipation    Past Surgical History:  Past Surgical History:  Procedure Laterality Date  . TEE WITHOUT CARDIOVERSION N/A 08/24/2013   Procedure: TRANSESOPHAGEAL ECHOCARDIOGRAM (TEE);  Surgeon: Josue Hector, MD;  Location: [facility] ENDOSCOPY;  Service: Cardiovascular;  Laterality: N/A;   HPI:  67 yo male adm to [facility] with severe sepsis.  Pt PMH + for lung cancer, smoker, embolic cva 0177.  Pt found to have right upper pna suspected - Diffuse metastatic disease noted.  He has had increased falls/weakness recently and was admitted with increased SOB, cough.  Spoke to RN at 1230 who reported pt was appropriate for evaluation of swallowing.  Daughter present for evaluation and admit pt had problems swallowing pill this am - found it pocketed and had him re=swallow.     Assessment / Plan / Recommendation Clinical Impression  Pt presents with complaints of dysphagia to solids - sensing foods lodging in pharynx.  Denies issues with liquids.  Pt CN exam negative.   He did have delay in swallow *uncertain if cognitive based?* but no indication of airway compromise with intake of liquids and applesauce.  Cough noted with intake of graham cracker and icecream - which may indicate pharyngeal/esophageal deficits.    Difficult to decipher aspiration/dysphagia related cough vs pt's baseline cough.     Recommend clear liquids today and follow up next  date - consider MBS vs esophagram to allow instrumental evaluation.  Pt consumed approximately 3 ounces water, 1 ounce applesauce, 1 tsp icecream and a single graham cracker during evaluation.    Note plans for possible IR procedure - ordered at approximately 1330 today.  Advised pt to cease further intake until assuring appropriate to eat.      Daughter and pt educated and agree to plan.  Medicine with puree - whole.   SLP Visit Diagnosis: Dysphagia, unspecified (R13.10)    Aspiration Risk  Mild aspiration risk;Moderate aspiration risk    Diet Recommendation (clear liquids, medicine with puree - start and follow with water)   Liquid Administration via: Straw Medication Administration: Whole meds with puree Supervision: Patient able to self feed Compensations: Minimize environmental distractions;Slow rate;Small sips/bites Postural Changes: Seated upright at 90 degrees;Remain upright for at least 30 minutes after po intake    Other  Recommendations Oral Care Recommendations: Oral care BID   Follow up Recommendations   tbd     Frequency and Duration min 2x/week  1 week       Prognosis Prognosis for Safe Diet Advancement: Fair Barriers to Reach Goals: Cognitive deficits(medical diagnosis)      Swallow Study   General Date of Onset: 08/10/17 HPI: 67 yo male adm to [facility] with severe sepsis.  Pt PMH + for lung cancer, smoker, embolic cva 9390.  Pt found to have right upper pna suspected - Diffuse metastatic disease noted.  He has had increased falls/weakness recently and was admitted  with increased SOB, cough.  Spoke to RN at 1230 who reported pt was appropriate for evaluation of swallowing.  Daugher present.  Type of Study: Bedside Swallow Evaluation Diet Prior to this Study: NPO(sips with medicine) Temperature Spikes Noted: No Respiratory Status: Room air History of Recent Intubation: No Behavior/Cognition: Alert;Cooperative;Other (Comment)(delayed responses) Oral Cavity  Assessment: Within Functional Limits Oral Care Completed by SLP: No Oral Cavity - Dentition: Missing dentition(does not use dentures, never has since obtained them years ago) Vision: Functional for self-feeding Self-Feeding Abilities: Able to feed self Patient Positioning: Upright in bed Baseline Vocal Quality: Normal Volitional Cough: Strong Volitional Swallow: Unable to elicit    Oral/Motor/Sensory Function Overall Oral Motor/Sensory Function: Within functional limits   Ice Chips Ice chips: Not tested   Thin Liquid Thin Liquid: Within functional limits Presentation: Straw;Self Fed Other Comments: 3 ounces consumed sequentially without deficits    Nectar Thick Nectar Thick Liquid: Not tested   Honey Thick Honey Thick Liquid: Not tested   Puree Puree: Impaired Other Comments: cough after intake of icecream, no indication of deficits with applesauce   Solid   GO   Solid: Impaired Pharyngeal Phase Impairments: Cough - Delayed Other Comments: cough after intake of graham cracker        Macario Golds 08/10/2017,2:07 PM  Luanna Salk, [place] [facility] SLP [phone]

## 2017-08-10 NOTE — Progress Notes (Signed)
Radiation Oncology         ([phone]) [phone] ________________________________  Initial inpatient Consultation  Name: Kirk Carson MRN: [id number]  Date of Service: 08/10/17 DOB: 07-20-1950  QI:[id number], No Pcp Per  No ref. provider found   REFERRING PHYSICIAN: No ref. provider found  DIAGNOSIS: The primary encounter diagnosis was Pneumonia of right upper lobe due to infectious organism [facility]). Diagnoses of Altered mental status, unspecified altered mental status type, Cancer ([place]), Mass, and Mass were also pertinent to this visit.    ICD-10-CM   1. Pneumonia of right upper lobe due to infectious organism ([place]) J18.1   2. Altered mental status, unspecified altered mental status type R41.82   3. Cancer ([place]) C80.1   4. Mass R22.9 [place] FNA SOFT TISSUE    [place] FNA SOFT TISSUE    CANCELED: IR Radiologist Eval & Mgmt    CANCELED: IR Radiologist Eval & Mgmt  5. Mass R22.9 [place] FNA SOFT TISSUE    [place] FNA SOFT TISSUE    CANCELED: IR Radiologist Eval & Mgmt    CANCELED: IR Radiologist Eval & Mgmt    HISTORY OF PRESENT ILLNESS: Kirk Carson is a 67 y.o. male seen at the request of Dr. 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 The patient has a history of TIA and CVA in 2015. He has otherwise been in good health, but a chronic tobacco user. He developed confusion gradually and progressive falls over the last month or so. Over the weekend, his symptoms progressed where he fell 3-4 times in the early morning. He also had hemoptysis that the family noticed for the first time.  He was brought to the ED and a CT brain revealed concerns for multiple brain metastases. His right upper lobe on CT imaging reveals concerns for a large lung mass measuring 8.9 x 7.7 x 5.5 cm and adenopathy in the mediastinum, subcarinial, and paratracheal as well as hilar regions. He underwent an MRI today that was limited by the lack of contrast since he was unable to lay still,  but did reveal concerns with innumerable lesions in the supra and infratentorial areas consistent with metastatic disease and the concern for tonsillar changes of the cerebellum through the foramen magnum. He received 10 mg Dexamethasone yesterday in the ED, and has been receiving 4 mg q 6 hours since. We're asked to see him to discuss options of treatment. Of note the patient does have subcutaneous nodules as well along the anterior chest wall that are planning to be sampled.    PREVIOUS RADIATION THERAPY: No  PAST MEDICAL HISTORY:  Past Medical History:  Diagnosis Date  . Embolic stroke ([place]) 5/63/87   multiple territories by MRI  . History of TIA (transient ischemic attack)   . Hyperlipidemia   . Hypertension   . Iron deficiency anemia   . Stroke [facility])    TIA 08/2013  . Tobacco abuse   . Unspecified constipation       PAST SURGICAL HISTORY: Past Surgical History:  Procedure Laterality Date  . TEE WITHOUT CARDIOVERSION N/A 08/24/2013   Procedure: TRANSESOPHAGEAL ECHOCARDIOGRAM (TEE);  Surgeon: Josue Hector, MD;  Location: [facility] ENDOSCOPY;  Service: Cardiovascular;  Laterality: N/A;    FAMILY HISTORY:  Family History  Problem Relation Age of Onset  . Birth defects Mother   . Colon cancer Neg Hx     SOCIAL HISTORY:  Social History   Socioeconomic History  . Marital  status: Married    Spouse name: Not on file  . Number of children: Not on file  . Years of education: Not on file  . Highest education level: Not on file  Occupational History  . Not on file  Social Needs  . Financial resource strain: Not on file  . Food insecurity:    Worry: Not on file    Inability: Not on file  . Transportation needs:    Medical: Not on file    Non-medical: Not on file  Tobacco Use  . Smoking status: Current Every Day Smoker    Packs/day: 1.00    Types: Cigarettes  . Smokeless tobacco: Never Used  Substance and Sexual Activity  . Alcohol use: Yes    Comment: occasionally  . Drug  use: No  . Sexual activity: Not on file  Lifestyle  . Physical activity:    Days per week: Not on file    Minutes per session: Not on file  . Stress: Not on file  Relationships  . Social connections:    Talks on phone: Not on file    Gets together: Not on file    Attends religious service: Not on file    Active member of club or organization: Not on file    Attends meetings of clubs or organizations: Not on file    Relationship status: Not on file  . Intimate partner violence:    Fear of current or ex partner: Not on file    Emotionally abused: Not on file    Physically abused: Not on file    Forced sexual activity: Not on file  Other Topics Concern  . Not on file  Social History Narrative   Lives in [place].  Works in [employer].  The patient is married but his wife has alcoholism and is not able to participate in his care at this time as a result. He lives with his daughter, and granddaughter. 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 He is originally from [place] and has been in the [place] for over 25 years.   ALLERGIES: Asa [aspirin]  MEDICATIONS:  Current Facility-Administered Medications  Medication Dose Route Frequency Provider Last Rate Last Dose  . acetaminophen (TYLENOL) tablet 650 mg  650 mg Oral Q4H PRN Lavina Hamman, MD      . alum & mag hydroxide-simeth (MAALOX/MYLANTA) 200-200-20 MG/5ML suspension 60 mL  60 mL Oral Q4H PRN Lavina Hamman, MD      . azithromycin [facility]) 500 mg in sodium chloride 0.9 % 250 mL IVPB  500 mg Intravenous Q24H Caccavale, Sophia, PA-C   Stopped at 08/09/17 2211  . cefTRIAXone (ROCEPHIN) 2 g in sodium chloride 0.9 % 100 mL IVPB  2 g Intravenous Q24H Caccavale, Sophia, PA-C   Stopped at 08/09/17 2141  . dexamethasone (DECADRON) injection 4 mg  4 mg Intravenous Q6H Green, Terri L, RPH   4 mg at 08/10/17 1230  . guaiFENesin (MUCINEX) 12 hr tablet 600 mg  600 mg Oral BID Lavina Hamman, MD        . guaiFENesin-dextromethorphan [facility] DM) 100-10 MG/5ML syrup 10 mL  10 mL Oral Q4H PRN Lavina Hamman, MD      . ipratropium-albuterol (DUONEB) 0.5-2.5 (3) MG/3ML nebulizer solution 3 mL  3 mL Nebulization Q6H Lavina Hamman, MD   3 mL at 08/10/17 1322  . nicotine (NICODERM CQ - dosed in mg/24 hours) patch 21 mg  21 mg Transdermal Daily Lavina Hamman, MD   21 mg at 08/10/17 1608  . ondansetron (ZOFRAN) tablet 4-8 mg  4-8 mg Oral Q8H PRN Lavina Hamman, MD       Or  . ondansetron (ZOFRAN-ODT) disintegrating tablet 4-8 mg  4-8 mg Oral Q8H PRN Lavina Hamman, MD       Or  . ondansetron [facility]) injection 4 mg  4 mg Intravenous Q8H PRN Lavina Hamman, MD       Or  . ondansetron (ZOFRAN) 8 mg in sodium chloride 0.9 % 50 mL IVPB  8 mg Intravenous Q8H PRN Lavina Hamman, MD      . pantoprazole (PROTONIX) EC tablet 40 mg  40 mg Oral Daily Vilma Prader, MD   40 mg at 08/10/17 1009  . senna-docusate (Senokot-S) tablet 1 tablet  1 tablet Oral QHS Lavina Hamman, MD      . senna-docusate (Senokot-S) tablet 2 tablet  2 tablet Oral BID PRN Lavina Hamman, MD      . sodium chloride flush (NS) 0.9 % injection 3 mL  3 mL Intravenous Q12H Vilma Prader, MD   3 mL at 08/10/17 0437  . zolpidem (AMBIEN) tablet 5 mg  5 mg Oral QHS PRN Lavina Hamman, MD        REVIEW OF SYSTEMS:  On review of systems, the patient reports that he is doing poorly. He is unable to answer most questions due to confusion but per his daughter has had hemoptysis on sevearl occasions since learning of this at home. She states he has been falling multiple times at home in the last few weeks. No other complaints are noted.     PHYSICAL EXAM:  Wt Readings from Last 3 Encounters:  02/26/14 180 lb (81.6 kg)  02/08/14 175 lb (79.4 kg)  01/25/14 175 lb (79.4 kg)   Temp Readings from Last 3 Encounters:  08/10/17 97.8 F (36.6 C) (Oral)  02/26/14 98.3 F (36.8 C) (Oral)  02/08/14 97.5 F (36.4 C)   BP  Readings from Last 3 Encounters:  08/10/17 110/76  02/26/14 146/76  02/08/14 135/70   Pulse Readings from Last 3 Encounters:  08/10/17 83  02/26/14 (!) 57  02/08/14 60    In general this is a confused and chronically ill appearing African male in no acute distress. He is alert and oriented to person and self. He tries getting out of the bed during our conversation on multiple occasions. HEENT reveals that the patient is normocephalic, atraumatic. EOMs are intact. PERRLA. Skin is intact without any evidence of gross lesions. Cardiovascular exam reveals a regular rate and rhythm, no clicks rubs or murmurs are auscultated. Chest is has wheezing to auscultation bilaterally. Lymphatic assessment is performed and does not reveal any adenopathy in the cervical, supraclavicular, axillary, or inguinal chains, but does reveal palpable subcutaneous nodules particularly below the left nipple. Abdomen has active bowel sounds in all quadrants and is intact. The abdomen is soft, non tender, non distended. Lower extremities are negative for pretibial pitting edema, deep calf tenderness, cyanosis. His fingers have digital clubbing. Cerebellar testing is abnormal with rapid alternating movements. He stammers during discussion, and is unable to complete finger to nose testing, and heel to shin assessment.    KPS = 40 100 - Normal; no complaints; no evidence of disease. 90   - Able to carry on normal activity; minor signs or symptoms of disease. 80   - Normal  activity with effort; some signs or symptoms of disease. 4   - Cares for self; unable to carry on normal activity or to do active work. 60   - Requires occasional assistance, but is able to care for most of his personal needs. 50   - Requires considerable assistance and frequent medical care. 58   - Disabled; requires special care and assistance. 34   - Severely disabled; hospital admission is indicated although death not imminent. 37   - Very sick;  hospital admission necessary; active supportive treatment necessary. 10   - Moribund; fatal processes progressing rapidly. 0     - Dead  Karnofsky DA, Abelmann Benton, Craver LS and Burchenal JH [phone]) The use of the nitrogen mustards in the palliative treatment of carcinoma: with particular reference to bronchogenic carcinoma Cancer 1 634-56  LABORATORY DATA:  Lab Results  Component Value Date   WBC 9.1 08/10/2017   HGB 13.9 08/10/2017   HCT 40.9 08/10/2017   MCV 97.6 08/10/2017   PLT 262 08/10/2017   Lab Results  Component Value Date   NA 137 08/10/2017   K 4.3 08/10/2017   CL 104 08/10/2017   CO2 21 (L) 08/10/2017   Lab Results  Component Value Date   ALT 35 08/10/2017   AST 73 (H) 08/10/2017   ALKPHOS 92 08/10/2017   BILITOT 0.7 08/10/2017     RADIOGRAPHY: Dg Chest 2 View  Result Date: 08/09/2017 CLINICAL DATA:  Cough, wheezing, and shortness of breath. EXAM: CHEST - 2 VIEW COMPARISON:  Chest x-ray dated Aug 23, 2013. FINDINGS: The heart size and mediastinal contours are within normal limits. Normal pulmonary vascularity. Consolidation within the right upper lobe. Coarsened interstitial markings in the peripheral mid to lower lungs is slightly progressed when compared to prior study. No pleural effusion or pneumothorax. No acute osseous abnormality. IMPRESSION: 1. Right upper lobe pneumonia. Followup PA and lateral chest X-ray is recommended in 3-4 weeks following trial of antibiotic therapy to ensure resolution and exclude underlying malignancy. 2. Chronic interstitial lung disease, slightly progressed. Electronically Signed   By: Titus Dubin M.D.   On: 08/09/2017 19:55   Ct Head Wo Contrast  Result Date: 08/09/2017 CLINICAL DATA:  Initial evaluation for acute ataxia, stroke suspected. EXAM: CT HEAD WITHOUT CONTRAST TECHNIQUE: Contiguous axial images were obtained from the base of the skull through the vertex without intravenous contrast. COMPARISON:  Prior MRI from  08/23/2013. FINDINGS: Brain: Study mildly degraded by motion artifact. Multiple hyperdense mass lesions are seen scattered throughout the bilateral cerebral hemispheres, consistent with metastatic disease. For reference purposes, largest lesion on the left seen air the left frontoparietal vertex and measures 13 mm (series 3, image 26). Largest lesion on the right position within the right parieto-occipital region and measures approximately 2.5 cm (series 5, image 54). Largest lesion on the left position within the left temporoparietal region and measures approximately 2.4 cm. 6 mm lesion noted at the dorsal left midbrain (series 3, image 12). 10 mm left cerebellar lesion (series 3, image 6). Lesions are hyperdense in appearance, and may be partially hemorrhagic in nature. Localized vasogenic edema about several of the lesions without midline shift or significant mass effect. Underlying cerebral atrophy with mild chronic small vessel ischemic disease. No acute large vessel territory infarct. No definite extra-axial collection on this motion degraded exam. Ventricles normal in size without hydrocephalus. Crowding at the basilar cisterns without frank tentorial herniation. Vascular: No appreciable hyperdense vessel. Skull: Scalp soft tissues within normal limits.  Calvarium intact. No focal osseous lesions. Sinuses/Orbits: Globes and orbital soft tissues within normal limits. Paranasal sinuses are clear. Changes related chronic maxillary sinusitis noted. No mastoid effusion. Other: None. IMPRESSION: Innumerable hyperdense lesions scattered throughout the supratentorial and infratentorial brain, consistent with metastatic disease. Localized vasogenic edema about several of the lesions without significant mass effect or midline shift. Several of these lesions are hyperdense in appearance, and may be partially hemorrhagic. Further evaluation with dedicated brain MRI and metastatic workup recommended. Electronically Signed    By: Jeannine Boga M.D.   On: 08/09/2017 22:21   Ct Angio Chest Pe W/cm &/or Wo Cm  Result Date: 08/10/2017 CLINICAL DATA:  Dyspnea EXAM: CT ANGIOGRAPHY CHEST WITH CONTRAST TECHNIQUE: Multidetector CT imaging of the chest was performed using the standard protocol during bolus administration of intravenous contrast. Multiplanar CT image reconstructions and MIPs were obtained to evaluate the vascular anatomy. CONTRAST:  74 cc ISOVUE-370 IOPAMIDOL (ISOVUE-370) INJECTION 76% COMPARISON:  CXR 08/09/2017 FINDINGS: Cardiovascular: Heart is within normal limits for size. No pericardial effusion. Minimal left main and three-vessel coronary arteriosclerosis. No acute pulmonary embolus. The pulmonary vessels to the right upper lobe are attenuated in appearance secondary to large right paramediastinal and suprahilar masslike abnormality likely representing an enlarged lymph node mass, estimated at 5.3 x 7.3 x 7.8 cm. No aortic aneurysm or dissection. Mediastinum/Nodes: Mediastinal adenopathy measuring 1.7 cm short axis in the subcarinal station, 2.3 cm in right lower paratracheal station, 2.1 cm right upper paratracheal station with bilateral hilar lymphadenopathy measuring up to 1.5 cm on the right and 1.2 cm short axis on the left. Confluent soft tissue masslike abnormality abutting the right superior mediastinum extending to the suprahilar portion of the mediastinum as above described likely represents a confluent lymph node mass attenuating the upper lobe pulmonary vessels and distal right mainstem bronchus. The thyroid gland is unremarkable. Midline trachea is identified. Luminal narrowing of the distal right mainstem bronchus secondary to a right paramediastinal/perihilar soft tissue mass is identified. Lungs/Pleura: Centrilobular and paraseptal emphysema with large confluent masslike abnormality centered within the right upper lobe measuring at least 8.9 x 7.7 x 5.5 cm. Small right effusion. Subpleural areas  of interstitial prominence and fibrosis. No definite mass identified of the left lung. Upper Abdomen: Innumerable subcutaneous metastatic nodules scattered throughout the visualized anterior, right lateral and posterior chest wall. The largest is approximately 2.3 cm on series 5/31 in the right anterolateral chest wall. Musculoskeletal: Degenerative disc disease of the upper and midthoracic spine without aggressive osseous lesion noted. Review of the MIP images confirms the above findings. IMPRESSION: 1. Extensive centrilobular and paraseptal emphysema throughout both lungs. 2. An 8.9 x 7.7 x 5.5 cm pulmonary mass centered within the right upper lobe is identified with confluent right paramediastinal, mediastinal and bilateral hilar lymphadenopathy with scattered areas of postobstructive pulmonary consolidation as well. Small right effusion is noted. Suspect findings are related to primary bronchogenic carcinoma with metastatic lymphadenopathy in the setting of COPD. Lymphoma may be within the differential though believed less likely. 3. Scattered metastatic soft tissue nodules noted of the chest wall, the largest is anterolateral measuring 2.3 cm. 4. Coronary arteriosclerosis and aortic atherosclerosis. 5. No acute pulmonary embolus. Aortic Atherosclerosis (ICD10-I70.0) and Emphysema (ICD10-J43.9). Electronically Signed   By: Ashley Royalty M.D.   On: 08/10/2017 00:24   Mr Brain Wo Contrast  Result Date: 08/10/2017 CLINICAL DATA:  Initial evaluation for acute altered mental status, metastatic disease. EXAM: MRI HEAD WITHOUT CONTRAST TECHNIQUE: Multiplanar, multiecho pulse sequences  of the brain and surrounding structures were obtained without intravenous contrast. COMPARISON:  Prior CT from earlier the same day. FINDINGS: Brain: Examination technically limited due to patient's inability to tolerate the full length of the exam. Additionally, images provided are degraded by motion artifact. Again seen are  innumerable metastatic lesions scattered throughout the bilateral cerebral and cerebellar hemispheres, more discernible and evident as compared to previous CT. Supratentorially, these lesions are primarily centered along the gray-white matter differentiation. There is extensive involvement throughout the cerebellum, with innumerable bilateral cerebellar metastases susceptibility artifacts seen associated with the preponderance of these lesions, consistent with hemorrhagic metastases. Few scattered superimposed fluid-fluid levels noted with these lesions, consistent with layering hematocrit (series 7, image 16 at the right parietal lobe for example). For reference purposes, largest discrete lesion in the right cerebral hemisphere positioned at the posterior right parietal lobe and measures 3.4 x 2.0 cm (series 7, image 17). Probable invasion into the adjacent superior sagittal sinus noted (series 7, image 19). Largest discrete lesion within the left cerebellar hemisphere positioned at the high anterior left frontal lobe and measures approximately 1.3 x 2.2 cm. Within the cerebellum, largest discrete lesion measures approximately 2.2 cm on the right (series 7, image 6), although exact measurements are difficult given the extensive tumor burden and lack of IV contrast. Prominent lesion at the right cerebellar peduncle noted as well. Probable metastasis at the optic chiasm (series 5, image 12). There is extensive vasogenic edema throughout the cerebellar hemisphere related to metastatic disease. Fourth ventricle is narrowed and attenuated but remains patent at this time. Crowding at the craniocervical junction with descent of the cerebellar tonsils of up to 6 mm through the foramen magnum. Basilar cisterns remain patent superiorly. No hydrocephalus or midline shift. No evidence for acute infarct.  No extra-axial fluid collection. Vascular: Major arterial vascular flow voids are maintained at the skull base. Tumor  invasion of the posterior aspect of the superior sagittal sinus (series 7, image 19). Skull and upper cervical spine: Vasogenic edema within the cerebellum with associated crowding at the craniocervical junction. Upper cervical spine within normal limits. Bone marrow signal intensity within normal limits. No obvious discrete lesion identified on this limited motion degraded exam. No scalp soft tissue abnormality. Sinuses/Orbits: Globes and orbital soft tissues within normal limits. Paranasal sinuses are clear. No mastoid effusion. Inner ear structures grossly normal. Other: None. IMPRESSION: 1. Limited study due to patient's inability to tolerate the full length of the exam and motion artifact. 2. 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 Localized vasogenic edema about numerous lesions, with diffuse edema throughout the cerebellum and posterior fossa. Mass effect on the fourth ventricle which is narrowed and attenuated, with crowding at the foramen magnum. No hydrocephalus or frank herniation at this time. No midline shift. 3. Tumor invasion of the posterior aspect of the superior sagittal sinus as above. Electronically Signed   By: Jeannine Boga M.D.   On: 08/10/2017 02:40      IMPRESSION/PLAN: 1. 67 y.o. gentleman with innumerable brain metastases in the setting of a large right upper lobe mass concerning for Stage IV lung cancer. Dr. Tammi Klippel and I have reviewed his MRI imaging and discussed his case with Dr. Mickeal Skinner as well. In summary, we'd like to await the results of his steroids on the edema in the brain. He will continue this with PPI prophylaxis per the primary team. We will plan simulation tomorrow for radiotherapy after discussing  the utility of the treatment to the whole brain and right chest. We will treat once we have documentation of his histology and would anticipate this to occur on  Wednesday afternoon or Thursday morning. We did review that starting any sooner could increase the inflammatory changes from radiotherapy that could worsen his intracranial pressure.  We discussed the risks, benefits, short, and long term effects of radiotherapy, and the patient and his daughter are interested in proceeding. Dr. Tammi Klippel anticipates 10 fractions of radiotherapy. Written consent is obtained and placed in the chart, a copy was provided to the patient, and signed by his daughter Tab.  In a visit lasting 90 minutes, greater than 50% of the time was spent face to face, discussing his case in floor time as well, and coordinating the patient's care.     Carola Rhine, [facility]   Page Me    \

## 2017-08-10 NOTE — Consult Note (Addendum)
Chief Complaint: Patient was seen in consultation today for ultrasound-guided biopsy of right chest wall mass Chief Complaint  Patient presents with  . Weakness  . Shortness of Breath    Referring Physician(s): Alva,R  Supervising Physician: Aletta Edouard  Patient Status: [facility]  History of Present Illness: Kirk Carson is a 67 y.o. male smoker with history of CVA in 2015, hypertension, hyperlipidemia, anemia who was recently admitted to [facility] with confusion, frequent falls, cough, hemoptysis, dyspnea and failure to thrive.  Subsequent imaging revealed extensive emphysema in both lungs along with right upper lobe pulmonary mass with associated right paramediastinal, mediastinal and bilateral hilar lymphadenopathy.  There were also scattered metastatic soft tissue nodules in the chest wall as well as widespread hemorrhagic brain metastases.  Request now received from pulmonology for ultrasound-guided right chest wall mass biopsy.   Past Medical History:  Diagnosis Date  . Embolic stroke ([place]) 1/54/00   multiple territories by MRI  . History of TIA (transient ischemic attack)   . Hyperlipidemia   . Hypertension   . Iron deficiency anemia   . Stroke [facility])    TIA 08/2013  . Tobacco abuse   . Unspecified constipation     Past Surgical History:  Procedure Laterality Date  . TEE WITHOUT CARDIOVERSION N/A 08/24/2013   Procedure: TRANSESOPHAGEAL ECHOCARDIOGRAM (TEE);  Surgeon: Josue Hector, MD;  Location: [facility] ENDOSCOPY;  Service: Cardiovascular;  Laterality: N/A;    Allergies: Asa [aspirin]  Medications: Prior to Admission medications   Medication Sig Start Date End Date Taking? Authorizing Provider  acetaminophen (TYLENOL) 325 MG tablet Take 650 mg by mouth every 6 (six) hours as needed for mild pain.   Yes [provider]     Family History  Problem Relation Age of Onset  . Birth defects Mother   . Colon cancer Neg Hx     Social  History   Socioeconomic History  . Marital status: Married    Spouse name: Not on file  . Number of children: Not on file  . Years of education: Not on file  . Highest education level: Not on file  Occupational History  . Not on file  Social Needs  . Financial resource strain: Not on file  . Food insecurity:    Worry: Not on file    Inability: Not on file  . Transportation needs:    Medical: Not on file    Non-medical: Not on file  Tobacco Use  . Smoking status: Current Every Day Smoker    Packs/day: 1.00    Types: Cigarettes  . Smokeless tobacco: Never Used  Substance and Sexual Activity  . Alcohol use: Yes    Comment: occasionally  . Drug use: No  . Sexual activity: Not on file  Lifestyle  . Physical activity:    Days per week: Not on file    Minutes per session: Not on file  . Stress: Not on file  Relationships  . Social connections:    Talks on phone: Not on file    Gets together: Not on file    Attends religious service: Not on file    Active member of club or organization: Not on file    Attends meetings of clubs or organizations: Not on file    Relationship status: Not on file  Other Topics Concern  . Not on file  Social History Narrative   Lives in [place].  Works in [employer].  Review of Systems see above; denies fever, chills, nausea /vomiting  Vital Signs: BP 110/76 (BP Location: Left Arm)   Pulse 83   Temp 97.8 F (36.6 C) (Oral)   Resp 17   SpO2 100%   Physical Exam patient awake, partially oriented;  Chest with scattered expiratory wheezes and rhonchi bilaterally; heart with regular rate and rhythm.  Abdomen soft, positive bowel sounds, nontender.  No lower extremity edema.  Imaging: Dg Chest 2 View  Result Date: 08/09/2017 CLINICAL DATA:  Cough, wheezing, and shortness of breath. EXAM: CHEST - 2 VIEW COMPARISON:  Chest x-ray dated Aug 23, 2013. FINDINGS: The heart size and mediastinal contours are within normal limits.  Normal pulmonary vascularity. Consolidation within the right upper lobe. Coarsened interstitial markings in the peripheral mid to lower lungs is slightly progressed when compared to prior study. No pleural effusion or pneumothorax. No acute osseous abnormality. IMPRESSION: 1. Right upper lobe pneumonia. Followup PA and lateral chest X-ray is recommended in 3-4 weeks following trial of antibiotic therapy to ensure resolution and exclude underlying malignancy. 2. Chronic interstitial lung disease, slightly progressed. Electronically Signed   By: Titus Dubin M.D.   On: 08/09/2017 19:55   Ct Head Wo Contrast  Result Date: 08/09/2017 CLINICAL DATA:  Initial evaluation for acute ataxia, stroke suspected. EXAM: CT HEAD WITHOUT CONTRAST TECHNIQUE: Contiguous axial images were obtained from the base of the skull through the vertex without intravenous contrast. COMPARISON:  Prior MRI from 08/23/2013. FINDINGS: Brain: Study mildly degraded by motion artifact. Multiple hyperdense mass lesions are seen scattered throughout the bilateral cerebral hemispheres, consistent with metastatic disease. For reference purposes, largest lesion on the left seen air the left frontoparietal vertex and measures 13 mm (series 3, image 26). Largest lesion on the right position within the right parieto-occipital region and measures approximately 2.5 cm (series 5, image 54). Largest lesion on the left position within the left temporoparietal region and measures approximately 2.4 cm. 6 mm lesion noted at the dorsal left midbrain (series 3, image 12). 10 mm left cerebellar lesion (series 3, image 6). Lesions are hyperdense in appearance, and may be partially hemorrhagic in nature. Localized vasogenic edema about several of the lesions without midline shift or significant mass effect. Underlying cerebral atrophy with mild chronic small vessel ischemic disease. No acute large vessel territory infarct. No definite extra-axial collection on this  motion degraded exam. Ventricles normal in size without hydrocephalus. Crowding at the basilar cisterns without frank tentorial herniation. Vascular: No appreciable hyperdense vessel. Skull: Scalp soft tissues within normal limits. Calvarium intact. No focal osseous lesions. Sinuses/Orbits: Globes and orbital soft tissues within normal limits. Paranasal sinuses are clear. Changes related chronic maxillary sinusitis noted. No mastoid effusion. Other: None. IMPRESSION: Innumerable hyperdense lesions scattered throughout the supratentorial and infratentorial brain, consistent with metastatic disease. Localized vasogenic edema about several of the lesions without significant mass effect or midline shift. Several of these lesions are hyperdense in appearance, and may be partially hemorrhagic. Further evaluation with dedicated brain MRI and metastatic workup recommended. Electronically Signed   By: Jeannine Boga M.D.   On: 08/09/2017 22:21   Ct Angio Chest Pe W/cm &/or Wo Cm  Result Date: 08/10/2017 CLINICAL DATA:  Dyspnea EXAM: CT ANGIOGRAPHY CHEST WITH CONTRAST TECHNIQUE: Multidetector CT imaging of the chest was performed using the standard protocol during bolus administration of intravenous contrast. Multiplanar CT image reconstructions and MIPs were obtained to evaluate the vascular anatomy. CONTRAST:  74 cc ISOVUE-370 IOPAMIDOL (ISOVUE-370) INJECTION 76% COMPARISON:  CXR 08/09/2017 FINDINGS: Cardiovascular: Heart is within normal limits for size. No pericardial effusion. Minimal left main and three-vessel coronary arteriosclerosis. No acute pulmonary embolus. The pulmonary vessels to the right upper lobe are attenuated in appearance secondary to large right paramediastinal and suprahilar masslike abnormality likely representing an enlarged lymph node mass, estimated at 5.3 x 7.3 x 7.8 cm. No aortic aneurysm or dissection. Mediastinum/Nodes: Mediastinal adenopathy measuring 1.7 cm short axis in the  subcarinal station, 2.3 cm in right lower paratracheal station, 2.1 cm right upper paratracheal station with bilateral hilar lymphadenopathy measuring up to 1.5 cm on the right and 1.2 cm short axis on the left. Confluent soft tissue masslike abnormality abutting the right superior mediastinum extending to the suprahilar portion of the mediastinum as above described likely represents a confluent lymph node mass attenuating the upper lobe pulmonary vessels and distal right mainstem bronchus. The thyroid gland is unremarkable. Midline trachea is identified. Luminal narrowing of the distal right mainstem bronchus secondary to a right paramediastinal/perihilar soft tissue mass is identified. Lungs/Pleura: Centrilobular and paraseptal emphysema with large confluent masslike abnormality centered within the right upper lobe measuring at least 8.9 x 7.7 x 5.5 cm. Small right effusion. Subpleural areas of interstitial prominence and fibrosis. No definite mass identified of the left lung. Upper Abdomen: Innumerable subcutaneous metastatic nodules scattered throughout the visualized anterior, right lateral and posterior chest wall. The largest is approximately 2.3 cm on series 5/31 in the right anterolateral chest wall. Musculoskeletal: Degenerative disc disease of the upper and midthoracic spine without aggressive osseous lesion noted. Review of the MIP images confirms the above findings. IMPRESSION: 1. Extensive centrilobular and paraseptal emphysema throughout both lungs. 2. An 8.9 x 7.7 x 5.5 cm pulmonary mass centered within the right upper lobe is identified with confluent right paramediastinal, mediastinal and bilateral hilar lymphadenopathy with scattered areas of postobstructive pulmonary consolidation as well. Small right effusion is noted. Suspect findings are related to primary bronchogenic carcinoma with metastatic lymphadenopathy in the setting of COPD. Lymphoma may be within the differential though believed less  likely. 3. Scattered metastatic soft tissue nodules noted of the chest wall, the largest is anterolateral measuring 2.3 cm. 4. Coronary arteriosclerosis and aortic atherosclerosis. 5. No acute pulmonary embolus. Aortic Atherosclerosis (ICD10-I70.0) and Emphysema (ICD10-J43.9). Electronically Signed   By: Ashley Royalty M.D.   On: 08/10/2017 00:24   Mr Brain Wo Contrast  Result Date: 08/10/2017 CLINICAL DATA:  Initial evaluation for acute altered mental status, metastatic disease. EXAM: MRI HEAD WITHOUT CONTRAST TECHNIQUE: Multiplanar, multiecho pulse sequences of the brain and surrounding structures were obtained without intravenous contrast. COMPARISON:  Prior CT from earlier the same day. FINDINGS: Brain: Examination technically limited due to patient's inability to tolerate the full length of the exam. Additionally, images provided are degraded by motion artifact. Again seen are innumerable metastatic lesions scattered throughout the bilateral cerebral and cerebellar hemispheres, more discernible and evident as compared to previous CT. Supratentorially, these lesions are primarily centered along the gray-white matter differentiation. There is extensive involvement throughout the cerebellum, with innumerable bilateral cerebellar metastases susceptibility artifacts seen associated with the preponderance of these lesions, consistent with hemorrhagic metastases. Few scattered superimposed fluid-fluid levels noted with these lesions, consistent with layering hematocrit (series 7, image 16 at the right parietal lobe for example). For reference purposes, largest discrete lesion in the right cerebral hemisphere positioned at the posterior right parietal lobe and measures 3.4 x 2.0 cm (series 7, image 17). Probable invasion into the adjacent superior sagittal  sinus noted (series 7, image 19). Largest discrete lesion within the left cerebellar hemisphere positioned at the high anterior left frontal lobe and measures  approximately 1.3 x 2.2 cm. Within the cerebellum, largest discrete lesion measures approximately 2.2 cm on the right (series 7, image 6), although exact measurements are difficult given the extensive tumor burden and lack of IV contrast. Prominent lesion at the right cerebellar peduncle noted as well. Probable metastasis at the optic chiasm (series 5, image 12). There is extensive vasogenic edema throughout the cerebellar hemisphere related to metastatic disease. Fourth ventricle is narrowed and attenuated but remains patent at this time. Crowding at the craniocervical junction with descent of the cerebellar tonsils of up to 6 mm through the foramen magnum. Basilar cisterns remain patent superiorly. No hydrocephalus or midline shift. No evidence for acute infarct.  No extra-axial fluid collection. Vascular: Major arterial vascular flow voids are maintained at the skull base. Tumor invasion of the posterior aspect of the superior sagittal sinus (series 7, image 19). Skull and upper cervical spine: Vasogenic edema within the cerebellum with associated crowding at the craniocervical junction. Upper cervical spine within normal limits. Bone marrow signal intensity within normal limits. No obvious discrete lesion identified on this limited motion degraded exam. No scalp soft tissue abnormality. Sinuses/Orbits: Globes and orbital soft tissues within normal limits. Paranasal sinuses are clear. No mastoid effusion. Inner ear structures grossly normal. Other: None. IMPRESSION: 1. Limited study due to patient's inability to tolerate the full length of the exam and motion artifact. 2. 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 Localized vasogenic edema about numerous lesions, with diffuse edema throughout the cerebellum and posterior fossa. Mass effect on the fourth ventricle which is narrowed and attenuated, with crowding at the  foramen magnum. No hydrocephalus or frank herniation at this time. No midline shift. 3. Tumor invasion of the posterior aspect of the superior sagittal sinus as above. Electronically Signed   By: Jeannine Boga M.D.   On: 08/10/2017 02:40    Labs:  CBC: Recent Labs    08/09/17 1935 08/10/17 1129  WBC 9.3 9.1  HGB 14.1 13.9  HCT 41.6 40.9  PLT 215 262    COAGS: No results for input(s): INR, APTT in the last 8760 hours.  BMP: Recent Labs    08/09/17 1935 08/10/17 1129  NA 131* 137  K 5.6* 4.3  CL 98* 104  CO2 22 21*  GLUCOSE 119* 115*  BUN 7 8  CALCIUM 9.5 9.6  CREATININE 0.73 0.77  GFRNONAA >60 >60  GFRAA >60 >60    LIVER FUNCTION TESTS: Recent Labs    08/09/17 1935 08/10/17 1129  BILITOT 1.7* 0.7  AST 116* 73*  ALT 53 35  ALKPHOS 103 92  PROT 6.9 7.1  ALBUMIN 2.9* 3.0*    TUMOR MARKERS: No results for input(s): AFPTM, CEA, CA199, CHROMGRNA in the last 8760 hours.  Assessment and Plan: 67 y.o. male smoker with history of CVA in 2015, hypertension, hyperlipidemia, anemia who was recently admitted to [facility] with confusion, frequent falls, cough, hemoptysis, dyspnea and failure to thrive.  Subsequent imaging revealed extensive emphysema in both lungs along with right upper lobe pulmonary mass with associated right paramediastinal, mediastinal and bilateral hilar lymphadenopathy.  There were also scattered metastatic soft tissue nodules in the chest wall as well as widespread hemorrhagic brain metastases.  Request now received from pulmonology for ultrasound-guided right chest wall mass biopsy.Imaging studies  have been reviewed by Dr. Laurence Ferrari.  Risks and benefits discussed with the patient's wife/daughter including, but not limited to bleeding, infection, damage to adjacent structures or low yield requiring additional tests.  All of the patient's questions were answered, patient is agreeable to proceed. Consent signed and in  chart.  Procedure tent planned for 5/1   Thank you for this interesting consult.  I greatly enjoyed meeting Kirk Carson and look forward to participating in their care.  A copy of this report was sent to the requesting provider on this date.  Electronically Signed: D. Rowe Robert, PA-C 08/10/2017, 4:15 PM   I spent a total of  25 minutes   in face to face in clinical consultation, greater than 50% of which was counseling/coordinating care for ultrasound-guided right chest wall mass biopsy

## 2017-08-10 NOTE — Consult Note (Signed)
Name: Kirk Carson MRN: [id number] DOB: [birth year]    ADMISSION DATE:  08/09/2017 CONSULTATION DATE:  4/29  REFERRING MD :  Posey Pronto   CHIEF COMPLAINT:   Lung Mass   BRIEF PATIENT DESCRIPTION:  This is a 67 year old male smoker w/ remote h/o CVA (no deficits)admitted early in the AM 4/29 w/ cc: freq falls, confusion, cough and some hemoptysis. His gait disturbance has evolved over the last couple weeks and also was running into doorways, he's been more forgetful, been falling, and more recently had worsening shortness of breath, cough w/ some hemoptysis. Family brought him in due to worsening respiratory distress, wheezing, weakness and FTT.  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 Also had vasogenic edema around the various lesions. There was also mass effect on the 4th ventricle. No herniation at this point or midline shift.  CT chest 4/29: extensive centrilobar and paraseptal emphysema t/o the lung Large 8.9X7.7X5.5 cm pulm mass centered w/in the right Upper lobe w/ right paramediastinal and bilateral mediastinal LA, as well as small right effusion, post-obstructive PNA and scattered soft tissue nodules.  Pulmonary asked to see re: possible Bronchoscopy. SIGNIFICANT EVENTS    STUDIES:  CT Chest 4/29: 1. Extensive centrilobular and paraseptal emphysema throughout both lungs.2. An 8.9 x 7.7 x 5.5 cm pulmonary mass centered within the right upper lobe is identified with confluent right paramediastinal, mediastinal and bilateral hilar lymphadenopathy with scattered areas of postobstructive pulmonary consolidation as well. Small right effusion is noted. 3. Scattered metastatic soft tissue nodules noted of the chest wall, the largest is anterolateral  measuring 2.3 cm.4. Coronary arteriosclerosis and aortic atherosclerosis. 5. No acute pulmonary embolus.   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  has a past surgical history that includes TEE without cardioversion (N/A, 08/24/2013). Prior to Admission medications   Medication Sig Start Date End Date Taking? Authorizing Provider  acetaminophen (TYLENOL) 325 MG tablet Take 650 mg by mouth every 6 (six) hours as needed for mild pain.   Yes [provider]   Allergies  Allergen Reactions  . Asa [Aspirin] Other (See Comments)    Occasionally causes heartburn (only with 325 mg tablets)    FAMILY HISTORY:  family history includes Birth defects in his mother. SOCIAL HISTORY:  reports that he has been smoking cigarettes.  He has been smoking about 1.00 pack per day. He has never used smokeless tobacco. He reports that he drinks alcohol. He reports that he does not use drugs.  REVIEW OF SYSTEMS:   Unable d/t delirium   SUBJECTIVE:   VITAL SIGNS: Temp:  [97.6 F (36.4 C)-98.4 F (36.9 C)] 97.6 F (36.4 C) (04/29 0239) Pulse Rate:  [92-107] 107 (04/29 0239) Resp:  [16-28] 18 (04/29 0239) BP: (114-130)/(84-105) 130/94 (04/29 0239) SpO2:  [94 %-100 %] 100 % (04/29 0239)  PHYSICAL EXAMINATION: General:  Well nourished aam confused but no distress Neuro:  Awake, ataxic gait and movement. Moves all extremities oriented only x1 sp clear currently  HEENT:  NCAT no JVD MMM Cardiovascular:  RRR Lungs/chest wall:  Clear to auscultation. Large palpable soft tissue  mass on right lateral chest wall  Abdomen:  Soft not tender + bowel sounds inc of stool  Musculoskeletal:  Awake and alert no focal def  Skin:  NCAT no JVD   Recent Labs  Lab  08/09/17 1935 08/10/17 1129  NA 131* 137  K 5.6* 4.3  CL 98* 104  CO2 22 21*  BUN 7 8  CREATININE 0.73 0.77  GLUCOSE 119* 115*   Recent Labs  Lab 08/09/17 1935 08/10/17 1129  HGB 14.1 13.9  HCT 41.6 40.9  WBC 9.3 9.1  PLT 215 262   Dg Chest 2 View  Result Date: 08/09/2017 CLINICAL DATA:  Cough, wheezing, and shortness of breath. EXAM: CHEST - 2 VIEW COMPARISON:  Chest x-ray dated Aug 23, 2013. FINDINGS: The heart size and mediastinal contours are within normal limits. Normal pulmonary vascularity. Consolidation within the right upper lobe. Coarsened interstitial markings in the peripheral mid to lower lungs is slightly progressed when compared to prior study. No pleural effusion or pneumothorax. No acute osseous abnormality. IMPRESSION: 1. Right upper lobe pneumonia. Followup PA and lateral chest X-ray is recommended in 3-4 weeks following trial of antibiotic therapy to ensure resolution and exclude underlying malignancy. 2. Chronic interstitial lung disease, slightly progressed. Electronically Signed   By: Titus Dubin M.D.   On: 08/09/2017 19:55   Ct Head Wo Contrast  Result Date: 08/09/2017 CLINICAL DATA:  Initial evaluation for acute ataxia, stroke suspected. EXAM: CT HEAD WITHOUT CONTRAST TECHNIQUE: Contiguous axial images were obtained from the base of the skull through the vertex without intravenous contrast. COMPARISON:  Prior MRI from 08/23/2013. FINDINGS: Brain: Study mildly degraded by motion artifact. Multiple hyperdense mass lesions are seen scattered throughout the bilateral cerebral hemispheres, consistent with metastatic disease. For reference purposes, largest lesion on the left seen air the left frontoparietal vertex and measures 13 mm (series 3, image 26). Largest lesion on the right position within the right parieto-occipital region and measures approximately 2.5 cm (series 5, image 54). Largest lesion on the left position within the left temporoparietal region and  measures approximately 2.4 cm. 6 mm lesion noted at the dorsal left midbrain (series 3, image 12). 10 mm left cerebellar lesion (series 3, image 6). Lesions are hyperdense in appearance, and may be partially hemorrhagic in nature. Localized vasogenic edema about several of the lesions without midline shift or significant mass effect. Underlying cerebral atrophy with mild chronic small vessel ischemic disease. No acute large vessel territory infarct. No definite extra-axial collection on this motion degraded exam. Ventricles normal in size without hydrocephalus. Crowding at the basilar cisterns without frank tentorial herniation. Vascular: No appreciable hyperdense vessel. Skull: Scalp soft tissues within normal limits. Calvarium intact. No focal osseous lesions. Sinuses/Orbits: Globes and orbital soft tissues within normal limits. Paranasal sinuses are clear. Changes related chronic maxillary sinusitis noted. No mastoid effusion. Other: None. IMPRESSION: Innumerable hyperdense lesions scattered throughout the supratentorial and infratentorial brain, consistent with metastatic disease. Localized vasogenic edema about several of the lesions without significant mass effect or midline shift. Several of these lesions are hyperdense in appearance, and may be partially hemorrhagic. Further evaluation with dedicated brain MRI and metastatic workup recommended. Electronically Signed   By: Jeannine Boga M.D.   On: 08/09/2017 22:21   Ct Angio Chest Pe W/cm &/or Wo Cm  Result Date: 08/10/2017 CLINICAL DATA:  Dyspnea EXAM: CT ANGIOGRAPHY CHEST WITH CONTRAST TECHNIQUE: Multidetector CT imaging of the chest was performed using the standard protocol during bolus administration of intravenous contrast.  Multiplanar CT image reconstructions and MIPs were obtained to evaluate the vascular anatomy. CONTRAST:  74 cc ISOVUE-370 IOPAMIDOL (ISOVUE-370) INJECTION 76% COMPARISON:  CXR 08/09/2017 FINDINGS: Cardiovascular: Heart is  within normal limits for size. No pericardial effusion. Minimal left main and three-vessel coronary arteriosclerosis. No acute pulmonary embolus. The pulmonary vessels to the right upper lobe are attenuated in appearance secondary to large right paramediastinal and suprahilar masslike abnormality likely representing an enlarged lymph node mass, estimated at 5.3 x 7.3 x 7.8 cm. No aortic aneurysm or dissection. Mediastinum/Nodes: Mediastinal adenopathy measuring 1.7 cm short axis in the subcarinal station, 2.3 cm in right lower paratracheal station, 2.1 cm right upper paratracheal station with bilateral hilar lymphadenopathy measuring up to 1.5 cm on the right and 1.2 cm short axis on the left. Confluent soft tissue masslike abnormality abutting the right superior mediastinum extending to the suprahilar portion of the mediastinum as above described likely represents a confluent lymph node mass attenuating the upper lobe pulmonary vessels and distal right mainstem bronchus. The thyroid gland is unremarkable. Midline trachea is identified. Luminal narrowing of the distal right mainstem bronchus secondary to a right paramediastinal/perihilar soft tissue mass is identified. Lungs/Pleura: Centrilobular and paraseptal emphysema with large confluent masslike abnormality centered within the right upper lobe measuring at least 8.9 x 7.7 x 5.5 cm. Small right effusion. Subpleural areas of interstitial prominence and fibrosis. No definite mass identified of the left lung. Upper Abdomen: Innumerable subcutaneous metastatic nodules scattered throughout the visualized anterior, right lateral and posterior chest wall. The largest is approximately 2.3 cm on series 5/31 in the right anterolateral chest wall. Musculoskeletal: Degenerative disc disease of the upper and midthoracic spine without aggressive osseous lesion noted. Review of the MIP images confirms the above findings. IMPRESSION: 1. Extensive centrilobular and paraseptal  emphysema throughout both lungs. 2. An 8.9 x 7.7 x 5.5 cm pulmonary mass centered within the right upper lobe is identified with confluent right paramediastinal, mediastinal and bilateral hilar lymphadenopathy with scattered areas of postobstructive pulmonary consolidation as well. Small right effusion is noted. Suspect findings are related to primary bronchogenic carcinoma with metastatic lymphadenopathy in the setting of COPD. Lymphoma may be within the differential though believed less likely. 3. Scattered metastatic soft tissue nodules noted of the chest wall, the largest is anterolateral measuring 2.3 cm. 4. Coronary arteriosclerosis and aortic atherosclerosis. 5. No acute pulmonary embolus. Aortic Atherosclerosis (ICD10-I70.0) and Emphysema (ICD10-J43.9). Electronically Signed   By: Ashley Royalty M.D.   On: 08/10/2017 00:24   Mr Brain Wo Contrast  Result Date: 08/10/2017 CLINICAL DATA:  Initial evaluation for acute altered mental status, metastatic disease. EXAM: MRI HEAD WITHOUT CONTRAST TECHNIQUE: Multiplanar, multiecho pulse sequences of the brain and surrounding structures were obtained without intravenous contrast. COMPARISON:  Prior CT from earlier the same day. FINDINGS: Brain: Examination technically limited due to patient's inability to tolerate the full length of the exam. Additionally, images provided are degraded by motion artifact. Again seen are innumerable metastatic lesions scattered throughout the bilateral cerebral and cerebellar hemispheres, more discernible and evident as compared to previous CT. Supratentorially, these lesions are primarily centered along the gray-white matter differentiation. There is extensive involvement throughout the cerebellum, with innumerable bilateral cerebellar metastases susceptibility artifacts seen associated with the preponderance of these lesions, consistent with hemorrhagic metastases. Few scattered superimposed fluid-fluid levels noted with these  lesions, consistent with layering hematocrit (series 7, image 16 at the right parietal lobe for example). For reference purposes, largest discrete lesion in the right cerebral  hemisphere positioned at the posterior right parietal lobe and measures 3.4 x 2.0 cm (series 7, image 17). Probable invasion into the adjacent superior sagittal sinus noted (series 7, image 19). Largest discrete lesion within the left cerebellar hemisphere positioned at the high anterior left frontal lobe and measures approximately 1.3 x 2.2 cm. Within the cerebellum, largest discrete lesion measures approximately 2.2 cm on the right (series 7, image 6), although exact measurements are difficult given the extensive tumor burden and lack of IV contrast. Prominent lesion at the right cerebellar peduncle noted as well. Probable metastasis at the optic chiasm (series 5, image 12). There is extensive vasogenic edema throughout the cerebellar hemisphere related to metastatic disease. Fourth ventricle is narrowed and attenuated but remains patent at this time. Crowding at the craniocervical junction with descent of the cerebellar tonsils of up to 6 mm through the foramen magnum. Basilar cisterns remain patent superiorly. No hydrocephalus or midline shift. No evidence for acute infarct.  No extra-axial fluid collection. Vascular: Major arterial vascular flow voids are maintained at the skull base. Tumor invasion of the posterior aspect of the superior sagittal sinus (series 7, image 19). Skull and upper cervical spine: Vasogenic edema within the cerebellum with associated crowding at the craniocervical junction. Upper cervical spine within normal limits. Bone marrow signal intensity within normal limits. No obvious discrete lesion identified on this limited motion degraded exam. No scalp soft tissue abnormality. Sinuses/Orbits: Globes and orbital soft tissues within normal limits. Paranasal sinuses are clear. No mastoid effusion. Inner ear structures  grossly normal. Other: None. IMPRESSION: 1. Limited study due to patient's inability to tolerate the full length of the exam and motion artifact. 2. 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 Localized vasogenic edema about numerous lesions, with diffuse edema throughout the cerebellum and posterior fossa. Mass effect on the fourth ventricle which is narrowed and attenuated, with crowding at the foramen magnum. No hydrocephalus or frank herniation at this time. No midline shift. 3. Tumor invasion of the posterior aspect of the superior sagittal sinus as above. Electronically Signed   By: Jeannine Boga M.D.   On: 08/10/2017 02:40    ASSESSMENT / PLAN:  Large right paramediastinal and suprahilar lung mass w/ associated LA and probable post obstructive PNA; further complicated by what appears to be hemorrhagic metastasis to brain w/ mass effect on 4th ventricle as well as what appears to be numerous metastatic soft tissue masses on the right chest wall (largest located in the right anterior chest wall).  Plan/rec First would try US guided biopsy of the right anterior chest wall mass. If this is non-diagnostic second approach should be CT guided biopsy. Although could consider bronch would not be safest option given 4th ventricle involvement cough during bronch would increase risk for herniation at this point.  Needs urgent XRT of brain   Erick Colace ACNP-BC [place] Pager # [phone] OR # [phone] if no answer  08/10/2017, 12:36 PM

## 2017-08-10 NOTE — H&P (Signed)
History and Physical   Kirk Carson [id number] DOB: Jun 07, 1950 DOA: 08/09/2017  PCP: No primary care provider on file.  Chief Complaint: confusion  HPI: this is a 67 year old man with medical problems including current every day smoker, history of CVA without residual deficit per family report presenting with frequent falls, confusion and cough. Of note, history is obtained via chart review and talking with the patient's daughter, Tab, who is at the bedside.  The patient retired within the past few months from his job, lives with his daughter is family. Over the course of the past few weeks he has had trouble ambulating, noticeably more difficult for him to navigate doorways. Family has noticed he's become more forgetful. In addition he has had multiple falls. He's had increasing shortness of breath with productive cough. Family just discovered today that he has been having hemoptysis. They say he takes no regular medications with the exception of an occasional acetaminophen. Family at bedside reports they have not noticed any nausea or vomiting. The patient is unable to provide history due to altered mental status.  ED Course: in the emergency department vital signs remarkable for tachycardia to 104, increased history rate 28, systolic blood pressure in the 120s.   Chest x-ray obtained revealed a right upper lobe pneumonia. Developer status a CT scan of the head was performed which revealed findings consistent with diffuse metastatic disease to the CNS, as well as localized vasogenic edema to multiple lesions, no midline shift or mass effect present.  CTA of the chest performed which revealed an [phone] cm pulmonary mass in the right upper lobe with associated mediastinal bilateral hilar lymphadenopathy concerning for primary bronchogenic carcinoma. No pulmonary embolus noted.  Labs reveal white count 9.3, normal hemoglobin.  CMP remarkable for T bili of 1.7, albumin 2.9, potassium 5.6.  The  patient was given 3 L of isotonic fluid as well as ceftriaxone and azithromycin. Hospital medicine was consult further management.  Review of Systems: A complete ROS was unable to be obtained secondary to patient's altered mental status.  Past Medical History:  Diagnosis Date  . Embolic stroke ([place]) 12/15/38   multiple territories by MRI  . History of TIA (transient ischemic attack)   . Hyperlipidemia   . Hypertension   . Iron deficiency anemia   . Stroke [facility])    TIA 08/2013  . Tobacco abuse   . Unspecified constipation    Social History   Socioeconomic History  . Marital status: Married    Spouse name: Not on file  . Number of children: Not on file  . Years of education: Not on file  . Highest education level: Not on file  Occupational History  . Not on file  Social Needs  . Financial resource strain: Not on file  . Food insecurity:    Worry: Not on file    Inability: Not on file  . Transportation needs:    Medical: Not on file    Non-medical: Not on file  Tobacco Use  . Smoking status: Current Every Day Smoker    Packs/day: 1.00    Types: Cigarettes  . Smokeless tobacco: Never Used  Substance and Sexual Activity  . Alcohol use: Yes    Comment: occasionally  . Drug use: No  . Sexual activity: Not on file  Lifestyle  . Physical activity:    Days per week: Not on file    Minutes per session: Not on file  . Stress: Not on file  Relationships  .  Social connections:    Talks on phone: Not on file    Gets together: Not on file    Attends religious service: Not on file    Active member of club or organization: Not on file    Attends meetings of clubs or organizations: Not on file    Relationship status: Not on file  . Intimate partner violence:    Fear of current or ex partner: Not on file    Emotionally abused: Not on file    Physically abused: Not on file    Forced sexual activity: Not on file  Other Topics Concern  . Not on file  Social History Narrative     Lives in [place].  Works in [employer].   Family History  Problem Relation Age of Onset  . Birth defects Mother   . Colon cancer Neg Hx     Physical Exam: Vitals:   08/09/17 2100 08/09/17 2130 08/09/17 2218 08/09/17 2230  BP: (!) 121/105 (!) 128/99  128/84  Pulse: 95 99  92  Resp: (!) 24 19  (!) 24  Temp:   98.4 F (36.9 C)   TempSrc:   Rectal   SpO2: 100% 97%  96%   General: Elderly black man, confused. ENT: Grossly normal hearing, dry mucus membranes. Cardiovascular: RRR. No M/R/G. No LE edema.   Respiratory: Diffuse rhonchi, poor air movement, increased RR to 24-28 Abdomen: Soft, non-tender. No rebound or guarding. Skin: No rash or induration seen on limited exam. Musculoskeletal: Grossly normal tone BUE/BLE.  Psychiatric: Cognition slowed, is verbal but confused, oriented to year and hospital, does not answer other questions Neurologic: Pupils bilaterally round and reactive to light, moves all extremities, bilateral finger to nose test slowed  I have personally reviewed the following labs, culture data, and imaging studies.  Assessment/Plan:  #Lung cancer with CNS metastases, suspected Course: presented with confusion, difficulty ambulating, productive cough, hemoptysis.  While a diagnosis of malignancy is not "confirmed" yet due to no tissue diagnosis, the clinical picture is suggestive of such. Assessment: tempo of disease and CNS burden make small cell a higher concern Plan: -Discussed with the patient's daughter the concern for malignancy and need for further work up -MRI brain pending; however, on CT scan of the head vasogenic edema was found; therefore, will start dexamethasone (10 mg IV followed by 4 mg q 6) + PPI prophylactically -Consider consultation of radiation oncology and/or neurosurgery in the AM for optimization of CNS disease which may include whole brain radiation -Consider IR guided vs pulmonary for tissue diagnosis  #Severe sepsis 2/2 to  bacterial pneumonia, post-obstructive On admission: tachycardic, increased RR, lactic acid elevated, with radiographic evidence of PNA. Plan: -continue empiric CAP coverage with ceftriaxone + azithromycin -follow up blood cx -repeat lactic acid to trend to normalization  #Other problems: -Hemoptysis: patient is poor historian, uncertain severity, better to err on side of caution and hold ppx chemical DVT ppx at this time, continue to re-evaluate -Encephalopathy: suspect related to CNS lesions, continue to monitor, NPO until his mental status improves -Hyperkalemia: mild at 5.6, no PO potassium, follow up AM labs to trend -Low albumin: noted  DVT prophylaxis: [place] hep Code Status: full code Disposition Plan:  To be determined Consults called: none yet, anticipate radiation oncology, medical oncology once tissue diagnosis obtained Admission status: admit to hospital medicine at [facility] for multidisciplinary oncologic team approach   Cheri Rous, MD [facility] Page:[phone]  If 7PM-7AM, please contact night-coverage [email or web address] Password  [email or web address]

## 2017-08-10 NOTE — Progress Notes (Signed)
[place] Telephone:([phone]) [phone]   [phone]  CONSULT NOTE  REFERRING PHYSICIAN: Dr. Berle Mull  REASON FOR CONSULTATION:  67 years old African-American male with highly suspicious metastatic lung cancer.  HPI Kirk Carson is a 67 y.o. male with past medical history significant for hypertension, dyslipidemia, iron deficiency anemia, stroke in 2015 and was treated with Plavix.  The patient was brought by his family to the emergency department yesterday complaining of confusion, dizzy spells as well as frequent falls.  He also was complaining of shortness of breath and cough productive of blood-tinged sputum.  Chest x-ray on 08/09/2017 showed right upper lobe pneumonia.  CT scan of the head without contrast on the same day showed innumerable hyperdense lesions scattered throughout the supratentorial and infratentorial brain consistent with metastatic disease.  There was localized vasogenic edema about several of the lesions without significant mass-effect or midline shift.  Several of these lesions are hyperdense in appearance and may be partially hemorrhagic.  The patient had MRI of the brain without contrast performed earlier today and that showed widespread hemorrhagic metastasis involving the supratentorial and infratentorial brain with heavy metastatic burden within the cerebellum.  There was localized vasogenic edema about numerous lesions with diffuse edema throughout the cerebellum and posterior fossa.  There was mass-effect on the fourth ventricle which is narrowed and attenuated, with crowding at the foramen magnum.  There was also tumor invasion of the posterior aspect of the superior sagittal sinus.  CT angiogram of the chest performed on 08/09/2017 showed an 8.9 x 7.7 x 5.5 cm pulmonary mass centered within the right upper lobe with confluent right paramediastinal, mediastinal and bilateral hilar lymphadenopathy with scattered areas of postobstructive pulmonary  consolidation as well.  There was also a small right pleural effusion noted.  The findings were suspicious for primary bronchogenic carcinoma with metastatic lymphadenopathy in the setting of COPD.  There was also a scattered metastatic soft tissue nodules noted of the chest wall, the largest is anterior lateral and measuring 2.3 cm. I was consulted by Dr. Posey Pronto to see the patient today for evaluation and recommendation regarding his condition.  He was a started on Decadron 10 mg IV initially and currently on 4 mg every 6 hours for the vasogenic edema.  He is feeling a little bit better. He continues to complain of cough and shortness of breath.  He denied having any current fever or chills.  He has no nausea, vomiting, diarrhea or constipation. Family history remarkable for heart disease and no history of malignancy. The patient is married and has 2 children his son and daughter.  His daughter was at the bedside.  He has a history of smoking more than 1 pack/day for over 50 years.  He also has a history of alcohol abuse in the past but not recently.  No history of drug abuse.  HPI  Past Medical History:  Diagnosis Date  . Embolic stroke ([place]) 7/37/10   multiple territories by MRI  . History of TIA (transient ischemic attack)   . Hyperlipidemia   . Hypertension   . Iron deficiency anemia   . Stroke [facility])    TIA 08/2013  . Tobacco abuse   . Unspecified constipation     Past Surgical History:  Procedure Laterality Date  . TEE WITHOUT CARDIOVERSION N/A 08/24/2013   Procedure: TRANSESOPHAGEAL ECHOCARDIOGRAM (TEE);  Surgeon: Josue Hector, MD;  Location: [facility];  Service: Cardiovascular;  Laterality: N/A;    Family History  Problem Relation Age of Onset  . Birth defects Mother   . Colon cancer Neg Hx     Social History Social History   Tobacco Use  . Smoking status: Current Every Day Smoker    Packs/day: 1.00    Types: Cigarettes  . Smokeless tobacco: Never Used  Substance  Use Topics  . Alcohol use: Yes    Comment: occasionally  . Drug use: No    Allergies  Allergen Reactions  . Asa [Aspirin] Other (See Comments)    Occasionally causes heartburn (only with 325 mg tablets)    Current Facility-Administered Medications  Medication Dose Route Frequency Provider Last Rate Last Dose  . acetaminophen (TYLENOL) tablet 650 mg  650 mg Oral Q4H PRN Lavina Hamman, MD      . alum & mag hydroxide-simeth (MAALOX/MYLANTA) 200-200-20 MG/5ML suspension 60 mL  60 mL Oral Q4H PRN Lavina Hamman, MD      . azithromycin [facility]) 500 mg in sodium chloride 0.9 % 250 mL IVPB  500 mg Intravenous Q24H Caccavale, Sophia, PA-C   Stopped at 08/09/17 2211  . cefTRIAXone (ROCEPHIN) 2 g in sodium chloride 0.9 % 100 mL IVPB  2 g Intravenous Q24H Caccavale, Sophia, PA-C   Stopped at 08/09/17 2141  . dexamethasone (DECADRON) injection 4 mg  4 mg Intravenous Q6H Green, Terri L, RPH      . guaiFENesin (MUCINEX) 12 hr tablet 600 mg  600 mg Oral BID Lavina Hamman, MD      . guaiFENesin-dextromethorphan [facility]) 100-10 MG/5ML syrup 10 mL  10 mL Oral Q4H PRN Lavina Hamman, MD      . ipratropium-albuterol (DUONEB) 0.5-2.5 (3) MG/3ML nebulizer solution 3 mL  3 mL Nebulization Q6H Lavina Hamman, MD   3 mL at 08/10/17 1322  . nicotine (NICODERM CQ - dosed in mg/24 hours) patch 21 mg  21 mg Transdermal Daily Lavina Hamman, MD      . ondansetron [facility]) tablet 4-8 mg  4-8 mg Oral Q8H PRN Lavina Hamman, MD       Or  . ondansetron (ZOFRAN-ODT) disintegrating tablet 4-8 mg  4-8 mg Oral Q8H PRN Lavina Hamman, MD       Or  . ondansetron [facility]) injection 4 mg  4 mg Intravenous Q8H PRN Lavina Hamman, MD       Or  . ondansetron (ZOFRAN) 8 mg in sodium chloride 0.9 % 50 mL IVPB  8 mg Intravenous Q8H PRN Lavina Hamman, MD      . pantoprazole (PROTONIX) EC tablet 40 mg  40 mg Oral Daily Vilma Prader, MD   40 mg at 08/10/17 1009  . senna-docusate (Senokot-S) tablet 1 tablet   1 tablet Oral QHS Lavina Hamman, MD      . senna-docusate (Senokot-S) tablet 2 tablet  2 tablet Oral BID PRN Lavina Hamman, MD      . sodium chloride flush (NS) 0.9 % injection 3 mL  3 mL Intravenous Q12H Vilma Prader, MD   3 mL at 08/10/17 0437  . zolpidem (AMBIEN) tablet 5 mg  5 mg Oral QHS PRN Lavina Hamman, MD        Review of Systems  Constitutional: positive for fatigue and weight loss Eyes: negative Ears, nose, mouth, throat, and face: negative Respiratory: positive for cough, dyspnea on exertion, hemoptysis and sputum Cardiovascular: negative Gastrointestinal: negative Genitourinary:negative Integument/breast: negative Hematologic/lymphatic: negative Musculoskeletal:negative Neurological: positive for coordination problems, headaches  and weakness Behavioral/Psych: negative Endocrine: negative Allergic/Immunologic: negative  Physical Exam  QMG:QQPYP, healthy, no distress, well developed and ill looking SKIN: skin color, texture, turgor are normal HEAD: Normocephalic, No masses, lesions, tenderness or abnormalities EYES: normal, PERRLA, Conjunctiva are pink and non-injected EARS: External ears normal, Canals clear OROPHARYNX:no exudate, no erythema and lips, buccal mucosa, and tongue normal  NECK: supple, no adenopathy, no JVD LYMPH:  no palpable lymphadenopathy, no hepatosplenomegaly LUNGS: expiratory wheezes bilaterally, scattered rhonchi bilaterally HEART: regular rate & rhythm, no murmurs and no gallops ABDOMEN:abdomen soft, non-tender, normal bowel sounds and no masses or organomegaly BACK: Back symmetric, no curvature., No CVA tenderness EXTREMITIES:no joint deformities, effusion, or inflammation, no edema  NEURO: alert & oriented x 3 with fluent speech, no focal motor/sensory deficits  PERFORMANCE STATUS: ECOG 1  LABORATORY DATA: Lab Results  Component Value Date   WBC 9.1 08/10/2017   HGB 13.9 08/10/2017   HCT 40.9 08/10/2017   MCV 97.6  08/10/2017   PLT 262 08/10/2017    @LASTCHEM @  RADIOGRAPHIC STUDIES: Dg Chest 2 View  Result Date: 08/09/2017 CLINICAL DATA:  Cough, wheezing, and shortness of breath. EXAM: CHEST - 2 VIEW COMPARISON:  Chest x-ray dated Aug 23, 2013. FINDINGS: The heart size and mediastinal contours are within normal limits. Normal pulmonary vascularity. Consolidation within the right upper lobe. Coarsened interstitial markings in the peripheral mid to lower lungs is slightly progressed when compared to prior study. No pleural effusion or pneumothorax. No acute osseous abnormality. IMPRESSION: 1. Right upper lobe pneumonia. Followup PA and lateral chest X-ray is recommended in 3-4 weeks following trial of antibiotic therapy to ensure resolution and exclude underlying malignancy. 2. Chronic interstitial lung disease, slightly progressed. Electronically Signed   By: Titus Dubin M.D.   On: 08/09/2017 19:55   Ct Head Wo Contrast  Result Date: 08/09/2017 CLINICAL DATA:  Initial evaluation for acute ataxia, stroke suspected. EXAM: CT HEAD WITHOUT CONTRAST TECHNIQUE: Contiguous axial images were obtained from the base of the skull through the vertex without intravenous contrast. COMPARISON:  Prior MRI from 08/23/2013. FINDINGS: Brain: Study mildly degraded by motion artifact. Multiple hyperdense mass lesions are seen scattered throughout the bilateral cerebral hemispheres, consistent with metastatic disease. For reference purposes, largest lesion on the left seen air the left frontoparietal vertex and measures 13 mm (series 3, image 26). Largest lesion on the right position within the right parieto-occipital region and measures approximately 2.5 cm (series 5, image 54). Largest lesion on the left position within the left temporoparietal region and measures approximately 2.4 cm. 6 mm lesion noted at the dorsal left midbrain (series 3, image 12). 10 mm left cerebellar lesion (series 3, image 6). Lesions are hyperdense in  appearance, and may be partially hemorrhagic in nature. Localized vasogenic edema about several of the lesions without midline shift or significant mass effect. Underlying cerebral atrophy with mild chronic small vessel ischemic disease. No acute large vessel territory infarct. No definite extra-axial collection on this motion degraded exam. Ventricles normal in size without hydrocephalus. Crowding at the basilar cisterns without frank tentorial herniation. Vascular: No appreciable hyperdense vessel. Skull: Scalp soft tissues within normal limits. Calvarium intact. No focal osseous lesions. Sinuses/Orbits: Globes and orbital soft tissues within normal limits. Paranasal sinuses are clear. Changes related chronic maxillary sinusitis noted. No mastoid effusion. Other: None. IMPRESSION: Innumerable hyperdense lesions scattered throughout the supratentorial and infratentorial brain, consistent with metastatic disease. Localized vasogenic edema about several of the lesions without significant mass effect or midline shift.  Several of these lesions are hyperdense in appearance, and may be partially hemorrhagic. Further evaluation with dedicated brain MRI and metastatic workup recommended. Electronically Signed   By: Jeannine Boga M.D.   On: 08/09/2017 22:21   Ct Angio Chest Pe W/cm &/or Wo Cm  Result Date: 08/10/2017 CLINICAL DATA:  Dyspnea EXAM: CT ANGIOGRAPHY CHEST WITH CONTRAST TECHNIQUE: Multidetector CT imaging of the chest was performed using the standard protocol during bolus administration of intravenous contrast. Multiplanar CT image reconstructions and MIPs were obtained to evaluate the vascular anatomy. CONTRAST:  74 cc ISOVUE-370 IOPAMIDOL (ISOVUE-370) INJECTION 76% COMPARISON:  CXR 08/09/2017 FINDINGS: Cardiovascular: Heart is within normal limits for size. No pericardial effusion. Minimal left main and three-vessel coronary arteriosclerosis. No acute pulmonary embolus. The pulmonary vessels to the  right upper lobe are attenuated in appearance secondary to large right paramediastinal and suprahilar masslike abnormality likely representing an enlarged lymph node mass, estimated at 5.3 x 7.3 x 7.8 cm. No aortic aneurysm or dissection. Mediastinum/Nodes: Mediastinal adenopathy measuring 1.7 cm short axis in the subcarinal station, 2.3 cm in right lower paratracheal station, 2.1 cm right upper paratracheal station with bilateral hilar lymphadenopathy measuring up to 1.5 cm on the right and 1.2 cm short axis on the left. Confluent soft tissue masslike abnormality abutting the right superior mediastinum extending to the suprahilar portion of the mediastinum as above described likely represents a confluent lymph node mass attenuating the upper lobe pulmonary vessels and distal right mainstem bronchus. The thyroid gland is unremarkable. Midline trachea is identified. Luminal narrowing of the distal right mainstem bronchus secondary to a right paramediastinal/perihilar soft tissue mass is identified. Lungs/Pleura: Centrilobular and paraseptal emphysema with large confluent masslike abnormality centered within the right upper lobe measuring at least 8.9 x 7.7 x 5.5 cm. Small right effusion. Subpleural areas of interstitial prominence and fibrosis. No definite mass identified of the left lung. Upper Abdomen: Innumerable subcutaneous metastatic nodules scattered throughout the visualized anterior, right lateral and posterior chest wall. The largest is approximately 2.3 cm on series 5/31 in the right anterolateral chest wall. Musculoskeletal: Degenerative disc disease of the upper and midthoracic spine without aggressive osseous lesion noted. Review of the MIP images confirms the above findings. IMPRESSION: 1. Extensive centrilobular and paraseptal emphysema throughout both lungs. 2. An 8.9 x 7.7 x 5.5 cm pulmonary mass centered within the right upper lobe is identified with confluent right paramediastinal, mediastinal and  bilateral hilar lymphadenopathy with scattered areas of postobstructive pulmonary consolidation as well. Small right effusion is noted. Suspect findings are related to primary bronchogenic carcinoma with metastatic lymphadenopathy in the setting of COPD. Lymphoma may be within the differential though believed less likely. 3. Scattered metastatic soft tissue nodules noted of the chest wall, the largest is anterolateral measuring 2.3 cm. 4. Coronary arteriosclerosis and aortic atherosclerosis. 5. No acute pulmonary embolus. Aortic Atherosclerosis (ICD10-I70.0) and Emphysema (ICD10-J43.9). Electronically Signed   By: Ashley Royalty M.D.   On: 08/10/2017 00:24   Mr Brain Wo Contrast  Result Date: 08/10/2017 CLINICAL DATA:  Initial evaluation for acute altered mental status, metastatic disease. EXAM: MRI HEAD WITHOUT CONTRAST TECHNIQUE: Multiplanar, multiecho pulse sequences of the brain and surrounding structures were obtained without intravenous contrast. COMPARISON:  Prior CT from earlier the same day. FINDINGS: Brain: Examination technically limited due to patient's inability to tolerate the full length of the exam. Additionally, images provided are degraded by motion artifact. Again seen are innumerable metastatic lesions scattered throughout the bilateral cerebral and cerebellar hemispheres, more  discernible and evident as compared to previous CT. Supratentorially, these lesions are primarily centered along the gray-white matter differentiation. There is extensive involvement throughout the cerebellum, with innumerable bilateral cerebellar metastases susceptibility artifacts seen associated with the preponderance of these lesions, consistent with hemorrhagic metastases. Few scattered superimposed fluid-fluid levels noted with these lesions, consistent with layering hematocrit (series 7, image 16 at the right parietal lobe for example). For reference purposes, largest discrete lesion in the right cerebral  hemisphere positioned at the posterior right parietal lobe and measures 3.4 x 2.0 cm (series 7, image 17). Probable invasion into the adjacent superior sagittal sinus noted (series 7, image 19). Largest discrete lesion within the left cerebellar hemisphere positioned at the high anterior left frontal lobe and measures approximately 1.3 x 2.2 cm. Within the cerebellum, largest discrete lesion measures approximately 2.2 cm on the right (series 7, image 6), although exact measurements are difficult given the extensive tumor burden and lack of IV contrast. Prominent lesion at the right cerebellar peduncle noted as well. Probable metastasis at the optic chiasm (series 5, image 12). There is extensive vasogenic edema throughout the cerebellar hemisphere related to metastatic disease. Fourth ventricle is narrowed and attenuated but remains patent at this time. Crowding at the craniocervical junction with descent of the cerebellar tonsils of up to 6 mm through the foramen magnum. Basilar cisterns remain patent superiorly. No hydrocephalus or midline shift. No evidence for acute infarct.  No extra-axial fluid collection. Vascular: Major arterial vascular flow voids are maintained at the skull base. Tumor invasion of the posterior aspect of the superior sagittal sinus (series 7, image 19). Skull and upper cervical spine: Vasogenic edema within the cerebellum with associated crowding at the craniocervical junction. Upper cervical spine within normal limits. Bone marrow signal intensity within normal limits. No obvious discrete lesion identified on this limited motion degraded exam. No scalp soft tissue abnormality. Sinuses/Orbits: Globes and orbital soft tissues within normal limits. Paranasal sinuses are clear. No mastoid effusion. Inner ear structures grossly normal. Other: None. IMPRESSION: 1. Limited study due to patient's inability to tolerate the full length of the exam and motion artifact. 2. 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 Localized vasogenic edema about numerous lesions, with diffuse edema throughout the cerebellum and posterior fossa. Mass effect on the fourth ventricle which is narrowed and attenuated, with crowding at the foramen magnum. No hydrocephalus or frank herniation at this time. No midline shift. 3. Tumor invasion of the posterior aspect of the superior sagittal sinus as above. Electronically Signed   By: Jeannine Boga M.D.   On: 08/10/2017 02:40    ASSESSMENT: This is a very pleasant 67 years old [place] American male with highly suspicious metastatic lung cancer, pending tissue diagnosis and presenting with large right upper lobe lung mass with mediastinal lymphadenopathy as well as suspicious postobstructive consolidation and lymphangitic spread.  The patient also has multiple brain metastasis.  PLAN: I had a lengthy discussion with the patient and his daughter about his current condition and further investigation to confirm his diagnosis and treatment options. I personally and independently reviewed the scan images and discussed the results with the patient and his daughter. I recommended for the patient to see pulmonary medicine for consideration of bronchoscopy plus/minus endobronchial ultrasound and biopsy for tissue diagnosis.  If the final pathology is consistent with adenocarcinoma, we will send the tissue block for molecular studies. The patient will need full staging work-up by  ordering a PET scan on outpatient basis after discharge. For the metastatic brain lesions, we will continue with the current Decadron 4 mg every 6 hours.  The patient will also need consultation with radiation oncology for consideration of whole brain irradiation. I will arrange for the patient a follow-up appointment with me at the cancer center for further evaluation and more detailed discussion of  his treatment options including palliative care and hospice referral versus palliative systemic chemotherapy. The patient and his daughter agreed to the current plan. The patient voices understanding of current disease status and treatment options and is in agreement with the current care plan.  All questions were answered. The patient knows to call the clinic with any problems, questions or concerns. We can certainly see the patient much sooner if necessary.  Thank you so much for allowing me to participate in the care of Kirk Carson. I will continue to follow up the patient with you and assist in his care.  Disclaimer: This note was dictated with voice recognition software. Similar sounding words can inadvertently be transcribed and may not be corrected upon review.   Eilleen Kempf August 10, 2017, 2:47 PM

## 2017-08-11 ENCOUNTER — Ambulatory Visit
Admit: 2017-08-11 | Discharge: 2017-08-11 | Disposition: A | Discharge status: Home / Self Care | Payer: Self-pay | Attending: Radiation Oncology | Admitting: Radiation Oncology

## 2017-08-11 DIAGNOSIS — J984 Other disorders of lung: Secondary | ICD-10-CM

## 2017-08-11 DIAGNOSIS — R229 Localized swelling, mass and lump, unspecified: Secondary | ICD-10-CM

## 2017-08-11 DIAGNOSIS — C7931 Secondary malignant neoplasm of brain: Secondary | ICD-10-CM

## 2017-08-11 LAB — CBC WITH DIFFERENTIAL/PLATELET
BASOS PCT: 0 %
Basophils Absolute: 0 10*3/uL (ref 0.0–0.1)
EOS PCT: 0 %
Eosinophils Absolute: 0 10*3/uL (ref 0.0–0.7)
HEMATOCRIT: 39.1 % (ref 39.0–52.0)
Hemoglobin: 13 g/dL (ref 13.0–17.0)
LYMPHS PCT: 13 %
Lymphs Abs: 1.6 10*3/uL (ref 0.7–4.0)
MCH: 32.4 pg (ref 26.0–34.0)
MCHC: 33.2 g/dL (ref 30.0–36.0)
MCV: 97.5 fL (ref 78.0–100.0)
MONO ABS: 0.6 10*3/uL (ref 0.1–1.0)
MONOS PCT: 5 %
NEUTROS ABS: 9.5 10*3/uL — AB (ref 1.7–7.7)
Neutrophils Relative %: 82 %
PLATELETS: 295 10*3/uL (ref 150–400)
RBC: 4.01 MIL/uL — ABNORMAL LOW (ref 4.22–5.81)
RDW: 13.1 % (ref 11.5–15.5)
WBC: 11.7 10*3/uL — ABNORMAL HIGH (ref 4.0–10.5)

## 2017-08-11 LAB — COMPREHENSIVE METABOLIC PANEL
ALT: 33 U/L (ref 17–63)
ANION GAP: 12 (ref 5–15)
AST: 67 U/L — ABNORMAL HIGH (ref 15–41)
Albumin: 2.7 g/dL — ABNORMAL LOW (ref 3.5–5.0)
Alkaline Phosphatase: 86 U/L (ref 38–126)
BILIRUBIN TOTAL: 0.6 mg/dL (ref 0.3–1.2)
BUN: 12 mg/dL (ref 6–20)
CO2: 19 mmol/L — ABNORMAL LOW (ref 22–32)
Calcium: 9.7 mg/dL (ref 8.9–10.3)
Chloride: 105 mmol/L (ref 101–111)
Creatinine, Ser: 0.72 mg/dL (ref 0.61–1.24)
GFR calc Af Amer: >60 mL/min (ref >60)
Glucose, Bld: 124 mg/dL — ABNORMAL HIGH (ref 65–99)
POTASSIUM: 4.3 mmol/L (ref 3.5–5.1)
Sodium: 136 mmol/L (ref 135–145)
TOTAL PROTEIN: 6.8 g/dL (ref 6.5–8.1)

## 2017-08-11 LAB — PROTIME-INR
INR: 0.94
PROTHROMBIN TIME: 12.5 s (ref 11.4–15.2)

## 2017-08-11 LAB — HIV ANTIBODY (ROUTINE TESTING W REFLEX): HIV Screen 4th Generation wRfx: NONREACTIVE

## 2017-08-11 LAB — LACTIC ACID, PLASMA: LACTIC ACID, VENOUS: 3.1 mmol/L — AB (ref 0.5–1.9)

## 2017-08-11 LAB — MAGNESIUM: MAGNESIUM: 2 mg/dL (ref 1.7–2.4)

## 2017-08-11 MED ORDER — IPRATROPIUM-ALBUTEROL 0.5-2.5 (3) MG/3ML IN SOLN
3.0000 mL | Freq: Three times a day (TID) | RESPIRATORY_TRACT | Status: DC
  Administered 2017-08-12 – 2017-08-19 (×21): 3 mL via RESPIRATORY_TRACT
  Filled 2017-08-11 (×23): qty 3

## 2017-08-11 MED ORDER — SODIUM CHLORIDE 0.9 % IV BOLUS
500.0000 mL | Freq: Once | INTRAVENOUS | Status: AC
  Administered 2017-08-11: 500 mL via INTRAVENOUS

## 2017-08-11 MED ORDER — SODIUM CHLORIDE 0.9 % IV SOLN
INTRAVENOUS | Status: DC
Start: 2017-08-11 — End: 2017-08-15
  Administered 2017-08-11 – 2017-08-15 (×6): via INTRAVENOUS

## 2017-08-11 NOTE — Progress Notes (Signed)
SLP Cancellation Note  Patient Details Name: Joangel Vanosdol MRN: [id number] DOB: 02-Mar-1951   Cancelled treatment:        Pt off floor at [facility].  Of note, pt had eval of swallow yesterday with recommendations for clear liquids as he reports dysphagia to solids.  Will follow up next date - note pt for IR BX tomorrow = therefore will see later in the day when he is hopefully able to have po.  Thanks.  Luanna Salk, [facility] SLP [place], [place] 08/11/2017, 2:51 PM

## 2017-08-11 NOTE — Progress Notes (Signed)
I spoke with IR. The patient will simulate today and plan biopsy hopefully early morning so he can have tissue confirmation prior to treatment. We anticipate having this information early Thursday and would then proceed with radiotherapy on Thursday.     Carola Rhine, PAC

## 2017-08-11 NOTE — Progress Notes (Signed)
[facility] Progress Note  Patient: Kirk Carson [id number]   PCP: Patient, No Pcp Per DOB: 26-Sep-1950   DOA: 08/09/2017   DOS: 08/11/2017   Date of Service: the patient was seen and examined on 08/11/2017  Subjective: Confusion remains.  No change since yesterday.  Cough is about the same as well as shortness of breath.  No nausea or vomiting.  Brief hospital course: Pt. 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 Currently further plan is continue IV antibiotics and further work-up for the lung mass.  Assessment and Plan: 1.  Community-acquired pneumonia. Possibility of postobstructive pneumonia cannot be ruled out. Currently on IV ceftriaxone and azithromycin. Follow-up on blood culture. Follow-up on sputum culture. Duo nebs as well as Mucinex as well as a flutter device.  2.  Primary lung mass. 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 Pulmonary consulted for biopsy and bronchoscopy, given the presence of multiple brain metastasis with hemorrhagic conversion they felt that IR guided biopsy of the chest nodule is a better option. IR consulted and patient will undergo biopsy of the seed location. Hematoma oncology, neuro oncology, radiation oncology consulted as well. Patient will undergo simulation for radiation today but ideally they would like to hold for improvement in edema with the help of IV steroids before proceeding with radiation to avoid further inflammatory CNS changes. Current plan is to perform IR guided biopsy on Wednesday, with earliest possible tissue diagnosis on Thursday and possible radiation on Thursday.  3.  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 Patient's condition is critical given that patient has significant brain metastasis with surrounding vasogenic edema as well as crowding of the fourth ventricle  and foramen magnum. Currently the patient does not have any signs of impending herniation. Will perform neuro checks every 4 hours. As above radiation oncology, neuro oncology consulted. Continue IV Decadron for now.  4.  Acute encephalopathy. In the setting of brain metastasis. Continue IV steroids. Speech therapy consult for swallowing. Avoid psychotropic medications. Monitor.  5.  Chronic diastolic dysfunction. No evidence of volume overload. Monitor. Echocardiogram 2015 shows grade 1 diastolic dysfunction.  Diet: Clear liquid diet DVT Prophylaxis: mechanical compression device  Advance goals of care discussion: full code  Family Communication: family was present at bedside, at the time of interview. The pt provided permission to discuss medical plan with the family. Opportunity was given to ask question and all questions were answered satisfactorily.   Disposition:  Discharge to be determined.  Consultants: Pulmonary, interventional radiology, radiation oncology, neuro-oncology, hematology oncology Procedures: none  Antibiotics: Anti-infectives (From admission, onward)   Start     Dose/Rate Route Frequency Ordered Stop   08/09/17 2100  cefTRIAXone (ROCEPHIN) 2 g in sodium chloride 0.9 % 100 mL IVPB     2 g 200 mL/hr over 30 Minutes Intravenous Every 24 hours 08/09/17 2052     08/09/17 2100  azithromycin (ZITHROMAX) 500 mg in sodium chloride 0.9 % 250 mL IVPB     500 mg 250 mL/hr over 60 Minutes Intravenous Every 24 hours 08/09/17 2052         Objective: Physical Exam: Vitals:   08/10/17 2124 08/11/17 0627 08/11/17 0758 08/11/17 1441  BP: 102/67 126/76  126/85  Pulse: 76 63  74  Resp: 20 18  16   Temp: 98.6 F (37 C) 97.6 F (36.4 C)  98.1 F (36.7 C)  TempSrc: Oral Oral  Oral  SpO2: 94% 99% 96% 100%  Weight:  70 kg (154 lb 5.2 oz)      Intake/Output Summary (Last 24 hours) at 08/11/2017 1619 Last data filed at 08/11/2017 1550 Gross per 24 hour  Intake  1210 ml  Output 1650 ml  Net -440 ml   Filed Weights   08/11/17 4166  Weight: 70 kg (154 lb 5.2 oz)   General: Alert, Awake and Oriented to Time, Place and Person. Appear in moderate distress, affect appropriate Eyes: PERRL, Conjunctiva normal ENT: Oral Mucosa clear moist. Neck: no JVD, no Abnormal Mass Or lumps Cardiovascular: S1 and S2 Present, no Murmur, Peripheral Pulses Present Respiratory: normal respiratory effort, Bilateral Air entry equal and Decreased, no use of accessory muscle, bilateral Crackles, bilateral wheezes Abdomen: Bowel Sound present, Soft and no tenderness, no hernia Skin: no redness, no Rash, no induration Extremities: no Pedal edema, no calf tenderness Neurologic: Grossly no focal neuro deficit. Bilaterally Equal motor strength  Data Reviewed: CBC: Recent Labs  Lab 08/09/17 1935 08/10/17 1129 08/11/17 0427  WBC 9.3 9.1 11.7*  NEUTROABS 6.3  --  9.5*  HGB 14.1 13.9 13.0  HCT 41.6 40.9 39.1  MCV 97.2 97.6 97.5  PLT 215 262 063   Basic Metabolic Panel: Recent Labs  Lab 08/09/17 1935 08/10/17 1129 08/11/17 0427  NA 131* 137 136  K 5.6* 4.3 4.3  CL 98* 104 105  CO2 22 21* 19*  GLUCOSE 119* 115* 124*  BUN 7 8 12   CREATININE 0.73 0.77 0.72  CALCIUM 9.5 9.6 9.7  MG  --   --  2.0    Liver Function Tests: Recent Labs  Lab 08/09/17 1935 08/10/17 1129 08/11/17 0427  AST 116* 73* 67*  ALT 53 35 33  ALKPHOS 103 92 86  BILITOT 1.7* 0.7 0.6  PROT 6.9 7.1 6.8  ALBUMIN 2.9* 3.0* 2.7*   No results for input(s): LIPASE, AMYLASE in the last 168 hours. No results for input(s): AMMONIA in the last 168 hours. Coagulation Profile: Recent Labs  Lab 08/11/17 0427  INR 0.94   Cardiac Enzymes: No results for input(s): CKTOTAL, CKMB, CKMBINDEX, TROPONINI in the last 168 hours. BNP (last 3 results) No results for input(s): PROBNP in the last 8760 hours. CBG: No results for input(s): GLUCAP in the last 168 hours. Studies: No results found.    Scheduled Meds: . dexamethasone  4 mg Intravenous Q6H  . guaiFENesin  600 mg Oral BID  . ipratropium-albuterol  3 mL Nebulization Q6H WA  . nicotine  21 mg Transdermal Daily  . pantoprazole  40 mg Oral Daily  . senna-docusate  1 tablet Oral QHS  . sodium chloride flush  3 mL Intravenous Q12H   Continuous Infusions: . sodium chloride 75 mL/hr at 08/11/17 1124  . azithromycin Stopped (08/10/17 2311)  . cefTRIAXone (ROCEPHIN)  IV Stopped (08/10/17 2125)  . ondansetron (ZOFRAN) IV     PRN Meds: acetaminophen, alum & mag hydroxide-simeth, guaiFENesin-dextromethorphan, ondansetron **OR** ondansetron **OR** ondansetron (ZOFRAN) IV **OR** ondansetron (ZOFRAN) IV, zolpidem  Time spent: 35 minutes,   Author: Berle Mull, MD [facility] Pager: [phone] 08/11/2017 4:19 PM  If 7PM-7AM, please contact night-coverage at [email or web address], password [email or web address]

## 2017-08-11 NOTE — Progress Notes (Signed)
  Radiation Oncology         [phone] ________________________________  Name: Kirk Carson MRN: [id number]  Date: 08/11/2017  DOB: 1950-07-04  INPATIENT  SIMULATION AND TREATMENT PLANNING NOTE    ICD-10-CM   1. Brain metastases ([place]) C79.31   2. 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  NARRATIVE:  The patient was brought to the [place].  Identity was confirmed.  All relevant records and images related to the planned course of therapy were reviewed.  The patient freely provided informed written consent to proceed with treatment after reviewing the details related to the planned course of therapy. The consent form was witnessed and verified by the simulation staff.  Then, the patient was set-up in a stable reproducible  supine position for radiation therapy.  CT images were obtained.  Surface markings were placed.  The CT images were loaded into the planning software.  Then the target and avoidance structures were contoured.  Treatment planning then occurred.  The radiation prescription was entered and confirmed.  Then, I designed and supervised the construction of a total of 3 medically necessary complex treatment devices, including a custom made thermoplastic mask used for immobilization and two complex multileaf collimators to cover the entire intracranial contents, while shielding the eyes and face.  Each [facility] is independently created to account for beam divergence.  The right and left lateral fields will be treated with 6 MV X-rays.  I have requested : Isodose Plan.   After the brain was set-up, the patient was re-set for the chest radiation.  CT images were obtained.  Surface markings were placed.  The CT images were loaded into the planning software.  Then the target and avoidance structures were contoured.  Treatment planning then occurred.  The radiation  prescription was entered and confirmed.  Then, I designed and supervised the construction of a total of 6 medically necessary complex treatment devices, including a BodyFix immobilization mold custom fitted to the patient along with 5 multileaf collimators conformally shaped radiation around the treatment target while shielding critical structures such as the heart and spinal cord maximally.  I have requested : 3D Simulation  I have requested a DVH of the following structures: Left lung, right lung, spinal cord, heart, esophagus, and target.  PLAN:  The whole brain and right upper lung will be treated to 30 Gy in 10 fractions for palliation.  ________________________________  Sheral Apley Tammi Klippel, M.D.

## 2017-08-12 ENCOUNTER — Inpatient Hospital Stay (HOSPITAL_COMMUNITY): Payer: Self-pay

## 2017-08-12 DIAGNOSIS — F17200 Nicotine dependence, unspecified, uncomplicated: Secondary | ICD-10-CM

## 2017-08-12 DIAGNOSIS — J189 Pneumonia, unspecified organism: Secondary | ICD-10-CM

## 2017-08-12 DIAGNOSIS — I10 Essential (primary) hypertension: Secondary | ICD-10-CM

## 2017-08-12 DIAGNOSIS — J181 Lobar pneumonia, unspecified organism: Secondary | ICD-10-CM

## 2017-08-12 LAB — CBC WITH DIFFERENTIAL/PLATELET
BASOS ABS: 0 10*3/uL (ref 0.0–0.1)
BASOS PCT: 0 %
Eosinophils Absolute: 0 10*3/uL (ref 0.0–0.7)
Eosinophils Relative: 0 %
HEMATOCRIT: 38.8 % — AB (ref 39.0–52.0)
HEMOGLOBIN: 13 g/dL (ref 13.0–17.0)
Lymphocytes Relative: 12 %
Lymphs Abs: 1.3 10*3/uL (ref 0.7–4.0)
MCH: 32.6 pg (ref 26.0–34.0)
MCHC: 33.5 g/dL (ref 30.0–36.0)
MCV: 97.2 fL (ref 78.0–100.0)
Monocytes Absolute: 0.9 10*3/uL (ref 0.1–1.0)
Monocytes Relative: 8 %
NEUTROS ABS: 9.2 10*3/uL — AB (ref 1.7–7.7)
NEUTROS PCT: 80 %
Platelets: 277 10*3/uL (ref 150–400)
RBC: 3.99 MIL/uL — AB (ref 4.22–5.81)
RDW: 13 % (ref 11.5–15.5)
WBC: 11.5 10*3/uL — AB (ref 4.0–10.5)

## 2017-08-12 LAB — BASIC METABOLIC PANEL
ANION GAP: 13 (ref 5–15)
BUN: 14 mg/dL (ref 6–20)
CALCIUM: 9.8 mg/dL (ref 8.9–10.3)
CHLORIDE: 105 mmol/L (ref 101–111)
CO2: 18 mmol/L — AB (ref 22–32)
Creatinine, Ser: 0.77 mg/dL (ref 0.61–1.24)
GFR calc non Af Amer: >60 mL/min (ref >60)
Glucose, Bld: 103 mg/dL — ABNORMAL HIGH (ref 65–99)
POTASSIUM: 4.3 mmol/L (ref 3.5–5.1)
Sodium: 136 mmol/L (ref 135–145)

## 2017-08-12 LAB — PROTIME-INR
INR: 0.97
PROTHROMBIN TIME: 12.8 s (ref 11.4–15.2)

## 2017-08-12 MED ORDER — FENTANYL CITRATE (PF) 100 MCG/2ML IJ SOLN
INTRAMUSCULAR | Status: AC | PRN
  Administered 2017-08-12: 50 ug via INTRAVENOUS

## 2017-08-12 MED ORDER — LIDOCAINE HCL (PF) 1 % IJ SOLN
INTRAMUSCULAR | Status: AC | PRN
  Administered 2017-08-12: 30 mL

## 2017-08-12 MED ORDER — MIDAZOLAM HCL 2 MG/2ML IJ SOLN
INTRAMUSCULAR | Status: AC
  Filled 2017-08-12: qty 2

## 2017-08-12 MED ORDER — LIDOCAINE HCL 1 % IJ SOLN
INTRAMUSCULAR | Status: AC
  Filled 2017-08-12: qty 20

## 2017-08-12 MED ORDER — MIDAZOLAM HCL 2 MG/2ML IJ SOLN
INTRAMUSCULAR | Status: AC | PRN
  Administered 2017-08-12: 1 mg via INTRAVENOUS

## 2017-08-12 MED ORDER — BUDESONIDE 0.5 MG/2ML IN SUSP
0.5000 mg | Freq: Two times a day (BID) | RESPIRATORY_TRACT | Status: DC
  Administered 2017-08-12 – 2017-08-21 (×18): 0.5 mg via RESPIRATORY_TRACT
  Filled 2017-08-12 (×18): qty 2

## 2017-08-12 MED ORDER — FENTANYL CITRATE (PF) 100 MCG/2ML IJ SOLN
INTRAMUSCULAR | Status: AC
  Filled 2017-08-12: qty 2

## 2017-08-12 MED ORDER — LEVETIRACETAM IN NACL 500 MG/100ML IV SOLN
500.0000 mg | Freq: Two times a day (BID) | INTRAVENOUS | Status: DC
  Administered 2017-08-12 – 2017-08-18 (×13): 500 mg via INTRAVENOUS
  Filled 2017-08-12 (×14): qty 100

## 2017-08-12 MED ORDER — HYDRALAZINE HCL 20 MG/ML IJ SOLN
5.0000 mg | Freq: Once | INTRAMUSCULAR | Status: AC
  Administered 2017-08-12: 5 mg via INTRAVENOUS
  Filled 2017-08-12: qty 1

## 2017-08-12 NOTE — Procedures (Signed)
Interventional Radiology Procedure Note  Procedure: US guided core biopsy of right lateral soft tissue mass  Complications: None  Estimated Blood Loss: < 10 mL  Findings: 16 G core bx x 6 of roughly 2.5 cm right lateral chest wall/lateral pectoral mass.  Venetia Night. Kathlene Cote, M.D Pager:  [phone]

## 2017-08-12 NOTE — Progress Notes (Signed)
Initial Nutrition Assessment  INTERVENTION:   Will monitor for nutritional needs following work-up.  NUTRITION DIAGNOSIS:   Increased nutrient needs related to cancer and cancer related treatments, chronic illness as evidenced by estimated needs.  GOAL:   Patient will meet greater than or equal to 90% of their needs  MONITOR:   PO intake, Labs, Weight trends, Supplement acceptance  REASON FOR ASSESSMENT:   Malnutrition Screening Tool   ASSESSMENT:   Pt. 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  Patient with confusion and currently lethargic with hallucinations. Per chart review, SLP attempted assessment but was unable to given lethargy and mental status. Will not order supplements until PO is safe for patient to consume.   Per chart review, pt with weight loss but last recorded weight from 2536, so uncertain of time frame.   Will monitor for appropriateness of nutrition assessment.  Labs reviewed. Medications reviewed.  NUTRITION - FOCUSED PHYSICAL EXAM:  Deferred given mental status.  Diet Order:   Diet Order           Diet regular Room service appropriate? Yes; Fluid consistency: Thin  Diet effective now          EDUCATION NEEDS:   Not appropriate for education at this time  Skin:  Skin Assessment: Reviewed RN Assessment  Last BM:  4/29  Height:   Ht Readings from Last 1 Encounters:  02/26/14 5\' 7"  (1.702 m)    Weight:   Wt Readings from Last 1 Encounters:  08/11/17 154 lb 5.2 oz (70 kg)    Ideal Body Weight:  67.7 kg  BMI:  Body mass index is 24.17 kg/m.  Estimated Nutritional Needs:   Kcal:  2000-2200  Protein:  95-105g  Fluid:  2L/day  Clayton Bibles, MS, RD, LDN [place] [facility] Pager: [phone] After Hours Pager: [phone]

## 2017-08-12 NOTE — Progress Notes (Addendum)
SLP Cancellation Note  Patient Details Name: Kirk Carson MRN: [id number] DOB: Jul 30, 1950   Cancelled treatment:       Reason Eval/Treat Not Completed: Other (comment)(pt currently lethargic, RN reports hallucinating when awake, will continue efforts)  Wife present and reports pt had speech deficits prior to admission - reports that had improved.  States now pt had hallucinations when awake.   Advised spouse not to give pt po until SLP evaluate. Spoke to RN and requested her to page this SLP if pt awakens adequately for po.  She reports pt received Versed for biopsy.   Luanna Salk, [facility] [facility] SLP [place], [place] 08/12/2017, 10:39 AM

## 2017-08-12 NOTE — Progress Notes (Signed)
PROGRESS NOTE    Kirk Carson  [id number] DOB: [birth year] DOA: 08/09/2017 PCP: Patient, No Pcp Per   Brief Narrative:  Pt. 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 Currently further plan is continue IV antibiotics and further work-up for the lung mass.     Assessment & Plan:   Principal Problem:   CAP (community acquired pneumonia) Active Problems:   Brain metastases ([place])   Hypertension   Tobacco use disorder   Cavitating mass in right upper lung lobe  #1 community-acquired pneumonia versus postobstructive pneumonia Patient has been pancultured cultures pending.  Sputum culture pending.  Continue empiric IV Rocephin and azithromycin.  Continue Mucinex and duo nebs. Add pulmicort. Supportive care.  Follow.  2.  Primary lung mass/multiple metastases to the brain Patient was seen in consultation by pulmonary for possible biopsy and bronchoscopy however given the presence of multiple brain metastases a concern for hemorrhagic conversion it was felt that IR guided biopsy of chest nodule was a better option.  IR has been consulted.  Patient is status post ultrasound-guided biopsy with results pending.  Continue IV Decadron for now.  We will place empirically on IV Keppra for seizure prophylaxis.  Hematology-oncology/neuro oncology/radiation oncology have been consulted.  Patient to undergo simulation for radiation.  Follow.  3.  Acute encephalopathy Secondary to brain metastases.  Continue IV steroids.  Will place on IV Keppra for seizure prophylaxis.  Speech therapy following.  Follow.  4.  Chronic diastolic dysfunction Compensated.  Follow.  5.  Hypertension Blood pressure stable.  6.  Tobacco use Tobacco cessation.  Continue nicotine patch.    DVT prophylaxis: SCDs Code Status: Full Family  Communication: Try to update family at bedside however they stated they have been updated from yesterday and just awaiting biopsy results. Disposition Plan: To be determined.   Consultants:   PCCM Dr Elsworth Soho 08/10/2017  Interventional radiology: Dr. Kathlene Cote 08/10/2017  Procedures:   CT angiogram chest 08/09/2017  CT head 08/01/2017  CXR 08/09/2017  Ultrasound-guided core biopsy of right chest wall mass 08/12/2017 per Dr. Kathlene Cote  Antimicrobials:   IV azithromycin 08/09/2017  IV Rocephin 08/09/2017   Subjective: Patient sleeping easily arousable.  Alert to self and place.  Keeps being repetitive.  Denies any chest pain or shortness of breath.  Family at bedside.  Objective: Vitals:   08/12/17 0855 08/12/17 1447 08/12/17 1600 08/12/17 1845  BP: 102/72   (!) 135/94  Pulse: 77     Resp: (!) 21   18  Temp:    98 F (36.7 C)  TempSrc:    Oral  SpO2: 100% 95%    Weight:   69.9 kg (154 lb)   Height:   5\' 4"  (1.626 m)     Intake/Output Summary (Last 24 hours) at 08/12/2017 1949 Last data filed at 08/12/2017 1632 Gross per 24 hour  Intake 2635 ml  Output 600 ml  Net 2035 ml   Filed Weights   08/11/17 0627 08/12/17 1600  Weight: 70 kg (154 lb 5.2 oz) 69.9 kg (154 lb)    Examination:  General exam: Appears calm and comfortable  Respiratory system: Fair air movement.  Occasional coarse breath sounds.  No wheezing.  Respiratory effort normal. Cardiovascular system: S1 & S2 heard, RRR. No JVD, murmurs, rubs, gallops or clicks. No pedal edema. Gastrointestinal system: Abdomen is nondistended,  soft and nontender. No organomegaly or masses felt. Normal bowel sounds heard. Central nervous system: Alert and oriented. No focal neurological deficits. Extremities: Symmetric 5 x 5 power. Skin: No rashes, lesions or ulcers Psychiatry: Judgement and insight appear normal. Mood & affect appropriate.     Data Reviewed: I have personally reviewed following labs and imaging  studies  CBC: Recent Labs  Lab 08/09/17 1935 08/10/17 1129 08/11/17 0427 08/12/17 0409  WBC 9.3 9.1 11.7* 11.5*  NEUTROABS 6.3  --  9.5* 9.2*  HGB 14.1 13.9 13.0 13.0  HCT 41.6 40.9 39.1 38.8*  MCV 97.2 97.6 97.5 97.2  PLT 215 262 295 017   Basic Metabolic Panel: Recent Labs  Lab 08/09/17 1935 08/10/17 1129 08/11/17 0427 08/12/17 0409  NA 131* 137 136 136  K 5.6* 4.3 4.3 4.3  CL 98* 104 105 105  CO2 22 21* 19* 18*  GLUCOSE 119* 115* 124* 103*  BUN 7 8 12 14   CREATININE 0.73 0.77 0.72 0.77  CALCIUM 9.5 9.6 9.7 9.8  MG  --   --  2.0  --    GFR: Estimated Creatinine Clearance: 77.1 mL/min (by C-G formula based on SCr of 0.77 mg/dL). Liver Function Tests: Recent Labs  Lab 08/09/17 1935 08/10/17 1129 08/11/17 0427  AST 116* 73* 67*  ALT 53 35 33  ALKPHOS 103 92 86  BILITOT 1.7* 0.7 0.6  PROT 6.9 7.1 6.8  ALBUMIN 2.9* 3.0* 2.7*   No results for input(s): LIPASE, AMYLASE in the last 168 hours. No results for input(s): AMMONIA in the last 168 hours. Coagulation Profile: Recent Labs  Lab 08/11/17 0427 08/12/17 0409  INR 0.94 0.97   Cardiac Enzymes: No results for input(s): CKTOTAL, CKMB, CKMBINDEX, TROPONINI in the last 168 hours. BNP (last 3 results) No results for input(s): PROBNP in the last 8760 hours. HbA1C: No results for input(s): HGBA1C in the last 72 hours. CBG: No results for input(s): GLUCAP in the last 168 hours. Lipid Profile: No results for input(s): CHOL, HDL, LDLCALC, TRIG, CHOLHDL, LDLDIRECT in the last 72 hours. Thyroid Function Tests: No results for input(s): TSH, T4TOTAL, FREET4, T3FREE, THYROIDAB in the last 72 hours. Anemia Panel: No results for input(s): VITAMINB12, FOLATE, FERRITIN, TIBC, IRON, RETICCTPCT in the last 72 hours. Sepsis Labs: Recent Labs  Lab 08/09/17 1940 08/09/17 2236 08/10/17 1129 08/11/17 0427  PROCALCITON  --   --  0.14  --   LATICACIDVEN 2.95* 2.77* 2.7* 3.1*    Recent Results (from the past 240  hour(s))  Blood Culture (routine x 2)     Status: None (Preliminary result)   Collection Time: 08/09/17 10:20 PM  Result Value Ref Range Status   Specimen Description BLOOD RIGHT ANTECUBITAL  Final   Special Requests   Final    BOTTLES DRAWN AEROBIC AND ANAEROBIC Blood Culture results may not be optimal due to an inadequate volume of blood received in culture bottles   Culture   Final    NO GROWTH 3 DAYS Performed at [facility] [address], [place], [place] [address]    Report Status PENDING  Incomplete         Radiology Studies: [place] Core Biopsy (lymph Nodes)  Result Date: 08/12/2017 CLINICAL DATA:  Right lung mass with extensive lymphadenopathy, brain metastases and metastatic soft tissue masses of the right chest wall. EXAM: ULTRASOUND GUIDED CORE BIOPSY OF RIGHT CHEST WALL MASS MEDICATIONS: 1.0 mg IV Versed; 50 mcg IV Fentanyl Total Moderate Sedation Time: 12  minutes. The patient's level of consciousness and physiologic status were continuously monitored during the procedure by Radiology nursing. PROCEDURE: The procedure, risks, benefits, and alternatives were explained to the patient. Questions regarding the procedure were encouraged and answered. The patient understands and consents to the procedure. A time out was performed prior to initiating the procedure. The right lateral chest wall was prepped with chlorhexidine in a sterile fashion, and a sterile drape was applied covering the operative field. A sterile gown and sterile gloves were used for the procedure. Local anesthesia was provided with 1% Lidocaine. Soft tissue masses in the right lateral chest wall were localized by ultrasound. Under ultrasound guidance, a total of 6 separate 16 gauge core biopsy samples were obtained. Core biopsy samples were submitted in both formalin and saline. COMPLICATIONS: None. FINDINGS: There are multiple adjacent soft tissue masses in the right lateral chest wall just lateral to the pectoral  musculature and near the axilla. The largest measures approximately 2.5 cm in greatest diameter. Solid tissue was obtained. IMPRESSION: Ultrasound core biopsy performed a right lateral chest wall mass. Electronically Signed   By: Aletta Edouard M.D.   On: 08/12/2017 13:20        Scheduled Meds: . budesonide (PULMICORT) nebulizer solution  0.5 mg Nebulization BID  . dexamethasone  4 mg Intravenous Q6H  . fentaNYL      . guaiFENesin  600 mg Oral BID  . ipratropium-albuterol  3 mL Nebulization TID  . lidocaine      . midazolam      . nicotine  21 mg Transdermal Daily  . pantoprazole  40 mg Oral Daily  . senna-docusate  1 tablet Oral QHS  . sodium chloride flush  3 mL Intravenous Q12H   Continuous Infusions: . sodium chloride 75 mL/hr at 08/12/17 0531  . azithromycin Stopped (08/11/17 2342)  . cefTRIAXone (ROCEPHIN)  IV Stopped (08/11/17 2229)  . levETIRAcetam Stopped (08/12/17 1632)  . ondansetron (ZOFRAN) IV       LOS: 2 days    Time spent: 35 mins    Irine Seal, MD [facility] Pager [phone]  If 7PM-7AM, please contact night-coverage [email or web address] Password [facility] 08/12/2017, 7:49 PM

## 2017-08-13 ENCOUNTER — Ambulatory Visit: Admit: 2017-08-13 | Payer: Self-pay

## 2017-08-13 ENCOUNTER — Encounter: Payer: Self-pay | Admitting: Radiation Oncology

## 2017-08-13 LAB — LACTIC ACID, PLASMA: Lactic Acid, Venous: 2.6 mmol/L (ref 0.5–1.9)

## 2017-08-13 LAB — CBC
HEMATOCRIT: 36.9 % — AB (ref 39.0–52.0)
Hemoglobin: 12.4 g/dL — ABNORMAL LOW (ref 13.0–17.0)
MCH: 32.7 pg (ref 26.0–34.0)
MCHC: 33.6 g/dL (ref 30.0–36.0)
MCV: 97.4 fL (ref 78.0–100.0)
PLATELETS: 261 10*3/uL (ref 150–400)
RBC: 3.79 MIL/uL — ABNORMAL LOW (ref 4.22–5.81)
RDW: 13.1 % (ref 11.5–15.5)
WBC: 9.9 10*3/uL (ref 4.0–10.5)

## 2017-08-13 LAB — BASIC METABOLIC PANEL
Anion gap: 11 (ref 5–15)
BUN: 18 mg/dL (ref 6–20)
CALCIUM: 9.5 mg/dL (ref 8.9–10.3)
CHLORIDE: 106 mmol/L (ref 101–111)
CO2: 19 mmol/L — AB (ref 22–32)
CREATININE: 0.68 mg/dL (ref 0.61–1.24)
GFR calc non Af Amer: >60 mL/min (ref >60)
Glucose, Bld: 110 mg/dL — ABNORMAL HIGH (ref 65–99)
Potassium: 4.2 mmol/L (ref 3.5–5.1)
Sodium: 136 mmol/L (ref 135–145)

## 2017-08-13 MED ORDER — LORAZEPAM 0.5 MG PO TABS
0.5000 mg | ORAL_TABLET | Freq: Four times a day (QID) | ORAL | Status: DC | PRN
  Administered 2017-08-13 – 2017-08-20 (×8): 0.5 mg via ORAL
  Filled 2017-08-13 (×8): qty 1

## 2017-08-13 NOTE — Progress Notes (Signed)
PROGRESS NOTE    Kirk Carson  [id number] DOB: January 10, 1951 DOA: 08/09/2017 PCP: Patient, No Pcp Per   Brief Narrative:  Pt. 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 Currently further plan is continue IV antibiotics and further work-up for the lung mass.     Assessment & Plan:   Principal Problem:   CAP (community acquired pneumonia) Active Problems:   Brain metastases ([place])   Hypertension   Tobacco use disorder   Cavitating mass in right upper lung lobe   Pneumonia of right upper lobe due to infectious organism ([place])  #1 community-acquired pneumonia versus postobstructive pneumonia Patient has been pancultured cultures pending.  Blood cultures pending with no growth to date.  Sputum culture pending.  Continue empiric IV Rocephin and azithromycin, scheduled nebs, Pulmicort, supportive care.  Follow.  2.  Primary lung mass/multiple metastases to the brain Patient was seen in consultation by pulmonary for possible biopsy and bronchoscopy however given the presence of multiple brain metastases a concern for hemorrhagic conversion it was felt that IR guided biopsy of chest nodule was a better option.  IR has been consulted.  Patient is status post ultrasound-guided biopsy with results pending.  Continue IV Decadron, IV Keppra.  Hematology-oncology/neuro oncology/radiation oncology have been consulted.  Patient to undergo simulation for radiation.  Follow.  3.  Acute encephalopathy/dysphagia Secondary to brain metastases.  Pleasantly confused.  Continue IV steroids.  IV Keppra for seizure prophylaxis.  SLP following.  Will place on IV Keppra for seizure prophylaxis.  Speech therapy following.  Follow.  4.  Chronic diastolic dysfunction Compensated.  Follow.  5.  Hypertension Stable.    6.  Tobacco  use Tobacco cessation.  Continue nicotine patch.    DVT prophylaxis: SCDs Code Status: Full Family Communication: Updated wife at bedside.   Disposition Plan: To be determined.   Consultants:   PCCM Dr Elsworth Soho 08/10/2017  Interventional radiology: Dr. Kathlene Cote 08/10/2017  Procedures:   CT angiogram chest 08/09/2017  CT head 08/01/2017  CXR 08/09/2017  Ultrasound-guided core biopsy of right chest wall mass 08/12/2017 per Dr. Kathlene Cote  Antimicrobials:   IV azithromycin 08/09/2017  IV Rocephin 08/09/2017   Subjective: Patient confused.  Thinks he is in [place].  Denies chest pain or shortness of breath.  Per RN patient refused medications this morning.    Objective: Vitals:   08/12/17 2043 08/12/17 2132 08/13/17 0605 08/13/17 0753  BP:  (!) 123/94 (!) 140/92   Pulse:  (!) 103 75   Resp:  20 20   Temp:  (!) 97.5 F (36.4 C) 98.3 F (36.8 C)   TempSrc:  Axillary Axillary   SpO2: 95% 100% 99% 94%  Weight:      Height:        Intake/Output Summary (Last 24 hours) at 08/13/2017 1323 Last data filed at 08/13/2017 2229 Gross per 24 hour  Intake 2332.5 ml  Output 1600 ml  Net 732.5 ml   Filed Weights   08/11/17 0627 08/12/17 1600  Weight: 70 kg (154 lb 5.2 oz) 69.9 kg (154 lb)    Examination:  General exam: NAD Respiratory system: Some coarse diffuse breath sounds.  Fair air movement.  Some expiratory wheezing. Cardiovascular system: Regular rate rhythm no murmurs rubs or gallops.  No JVD.  No pedal edema.  Gastrointestinal system: Abdomen is soft, nontender, nondistended, positive bowel  sounds.  No rebound.  No guarding.  Central nervous system: Alert and pleasantly confused.  No focal neurological deficits. Extremities: Symmetric 5 x 5 power. Skin: No rashes, lesions or ulcers Psychiatry: Judgement and insight appear normal. Mood & affect appropriate.     Data Reviewed: I have personally reviewed following labs and imaging studies  CBC: Recent Labs  Lab  08/09/17 1935 08/10/17 1129 08/11/17 0427 08/12/17 0409 08/13/17 0404  WBC 9.3 9.1 11.7* 11.5* 9.9  NEUTROABS 6.3  --  9.5* 9.2*  --   HGB 14.1 13.9 13.0 13.0 12.4*  HCT 41.6 40.9 39.1 38.8* 36.9*  MCV 97.2 97.6 97.5 97.2 97.4  PLT 215 262 295 277 741   Basic Metabolic Panel: Recent Labs  Lab 08/09/17 1935 08/10/17 1129 08/11/17 0427 08/12/17 0409 08/13/17 0404  NA 131* 137 136 136 136  K 5.6* 4.3 4.3 4.3 4.2  CL 98* 104 105 105 106  CO2 22 21* 19* 18* 19*  GLUCOSE 119* 115* 124* 103* 110*  BUN 7 8 12 14 18   CREATININE 0.73 0.77 0.72 0.77 0.68  CALCIUM 9.5 9.6 9.7 9.8 9.5  MG  --   --  2.0  --   --    GFR: Estimated Creatinine Clearance: 77.1 mL/min (by C-G formula based on SCr of 0.68 mg/dL). Liver Function Tests: Recent Labs  Lab 08/09/17 1935 08/10/17 1129 08/11/17 0427  AST 116* 73* 67*  ALT 53 35 33  ALKPHOS 103 92 86  BILITOT 1.7* 0.7 0.6  PROT 6.9 7.1 6.8  ALBUMIN 2.9* 3.0* 2.7*   No results for input(s): LIPASE, AMYLASE in the last 168 hours. No results for input(s): AMMONIA in the last 168 hours. Coagulation Profile: Recent Labs  Lab 08/11/17 0427 08/12/17 0409  INR 0.94 0.97   Cardiac Enzymes: No results for input(s): CKTOTAL, CKMB, CKMBINDEX, TROPONINI in the last 168 hours. BNP (last 3 results) No results for input(s): PROBNP in the last 8760 hours. HbA1C: No results for input(s): HGBA1C in the last 72 hours. CBG: No results for input(s): GLUCAP in the last 168 hours. Lipid Profile: No results for input(s): CHOL, HDL, LDLCALC, TRIG, CHOLHDL, LDLDIRECT in the last 72 hours. Thyroid Function Tests: No results for input(s): TSH, T4TOTAL, FREET4, T3FREE, THYROIDAB in the last 72 hours. Anemia Panel: No results for input(s): VITAMINB12, FOLATE, FERRITIN, TIBC, IRON, RETICCTPCT in the last 72 hours. Sepsis Labs: Recent Labs  Lab 08/09/17 2236 08/10/17 1129 08/11/17 0427 08/13/17 0404  PROCALCITON  --  0.14  --   --   LATICACIDVEN  2.77* 2.7* 3.1* 2.6*    Recent Results (from the past 240 hour(s))  Blood Culture (routine x 2)     Status: None (Preliminary result)   Collection Time: 08/09/17 10:20 PM  Result Value Ref Range Status   Specimen Description BLOOD RIGHT ANTECUBITAL  Final   Special Requests   Final    BOTTLES DRAWN AEROBIC AND ANAEROBIC Blood Culture results may not be optimal due to an inadequate volume of blood received in culture bottles   Culture   Final    NO GROWTH 3 DAYS Performed at [facility] [address], [place], [place] [address]    Report Status PENDING  Incomplete         Radiology Studies: [place] Core Biopsy (lymph Nodes)  Result Date: 08/12/2017 CLINICAL DATA:  Right lung mass with extensive lymphadenopathy, brain metastases and metastatic soft tissue masses of the right chest wall. EXAM: ULTRASOUND GUIDED  CORE BIOPSY OF RIGHT CHEST WALL MASS MEDICATIONS: 1.0 mg IV Versed; 50 mcg IV Fentanyl Total Moderate Sedation Time: 12 minutes. The patient's level of consciousness and physiologic status were continuously monitored during the procedure by Radiology nursing. PROCEDURE: The procedure, risks, benefits, and alternatives were explained to the patient. Questions regarding the procedure were encouraged and answered. The patient understands and consents to the procedure. A time out was performed prior to initiating the procedure. The right lateral chest wall was prepped with chlorhexidine in a sterile fashion, and a sterile drape was applied covering the operative field. A sterile gown and sterile gloves were used for the procedure. Local anesthesia was provided with 1% Lidocaine. Soft tissue masses in the right lateral chest wall were localized by ultrasound. Under ultrasound guidance, a total of 6 separate 16 gauge core biopsy samples were obtained. Core biopsy samples were submitted in both formalin and saline. COMPLICATIONS: None. FINDINGS: There are multiple adjacent soft tissue masses  in the right lateral chest wall just lateral to the pectoral musculature and near the axilla. The largest measures approximately 2.5 cm in greatest diameter. Solid tissue was obtained. IMPRESSION: Ultrasound core biopsy performed a right lateral chest wall mass. Electronically Signed   By: Aletta Edouard M.D.   On: 08/12/2017 13:20        Scheduled Meds: . budesonide (PULMICORT) nebulizer solution  0.5 mg Nebulization BID  . dexamethasone  4 mg Intravenous Q6H  . guaiFENesin  600 mg Oral BID  . ipratropium-albuterol  3 mL Nebulization TID  . nicotine  21 mg Transdermal Daily  . pantoprazole  40 mg Oral Daily  . senna-docusate  1 tablet Oral QHS  . sodium chloride flush  3 mL Intravenous Q12H   Continuous Infusions: . sodium chloride 100 mL/hr at 08/13/17 0545  . azithromycin Stopped (08/12/17 2338)  . cefTRIAXone (ROCEPHIN)  IV Stopped (08/12/17 2230)  . levETIRAcetam Stopped (08/13/17 1006)  . ondansetron (ZOFRAN) IV       LOS: 3 days    Time spent: 35 mins    Irine Seal, MD [facility] Pager [phone]  If 7PM-7AM, please contact night-coverage [email or web address] Password [email or web address] 08/13/2017, 1:23 PM

## 2017-08-13 NOTE — Progress Notes (Signed)
Informed Katie, RT on L2 of this finding. She verbalized understanding.

## 2017-08-13 NOTE — Progress Notes (Signed)
CRITICAL VALUE ALERT  Critical Value: Lactic Acid 2.6  Date & Time Notied:  0510 08/13/17  Provider Notified: Tamala Julian  Orders Received/Actions taken: No orders at this time

## 2017-08-13 NOTE — Progress Notes (Signed)
SLP Cancellation Note  Patient Details Name: Kirk Carson MRN: [id number] DOB: 12-Jul-1950   Cancelled treatment:        Pt leaving for radiation with SLP arrived to room. Will continue efforts.    Houston Siren 08/13/2017, 3:31 PM   Orbie Pyo Colvin Caroli.Ed [employer] [phone]

## 2017-08-14 ENCOUNTER — Ambulatory Visit
Admit: 2017-08-14 | Discharge: 2017-08-14 | Disposition: A | Discharge status: Home / Self Care | Payer: Self-pay | Attending: Radiation Oncology | Admitting: Radiation Oncology

## 2017-08-14 ENCOUNTER — Ambulatory Visit: Payer: Self-pay

## 2017-08-14 DIAGNOSIS — I5032 Chronic diastolic (congestive) heart failure: Secondary | ICD-10-CM

## 2017-08-14 LAB — CBC
HCT: 37.4 % — ABNORMAL LOW (ref 39.0–52.0)
Hemoglobin: 12.6 g/dL — ABNORMAL LOW (ref 13.0–17.0)
MCH: 32.8 pg (ref 26.0–34.0)
MCHC: 33.7 g/dL (ref 30.0–36.0)
MCV: 97.4 fL (ref 78.0–100.0)
PLATELETS: 267 10*3/uL (ref 150–400)
RBC: 3.84 MIL/uL — AB (ref 4.22–5.81)
RDW: 13.1 % (ref 11.5–15.5)
WBC: 11.7 10*3/uL — AB (ref 4.0–10.5)

## 2017-08-14 LAB — CULTURE, BLOOD (ROUTINE X 2): Culture: NO GROWTH

## 2017-08-14 LAB — BASIC METABOLIC PANEL
Anion gap: 10 (ref 5–15)
BUN: 19 mg/dL (ref 6–20)
CHLORIDE: 107 mmol/L (ref 101–111)
CO2: 20 mmol/L — ABNORMAL LOW (ref 22–32)
CREATININE: 0.77 mg/dL (ref 0.61–1.24)
Calcium: 9.5 mg/dL (ref 8.9–10.3)
Glucose, Bld: 114 mg/dL — ABNORMAL HIGH (ref 65–99)
Potassium: 4.4 mmol/L (ref 3.5–5.1)
SODIUM: 137 mmol/L (ref 135–145)

## 2017-08-14 LAB — GLUCOSE, CAPILLARY: GLUCOSE-CAPILLARY: 119 mg/dL — AB (ref 65–99)

## 2017-08-14 LAB — LACTIC ACID, PLASMA: LACTIC ACID, VENOUS: 2.8 mmol/L — AB (ref 0.5–1.9)

## 2017-08-14 MED ORDER — SODIUM CHLORIDE 0.9 % IV BOLUS
500.0000 mL | Freq: Once | INTRAVENOUS | Status: AC
Start: 2017-08-14 — End: 2017-08-14
  Administered 2017-08-14: 500 mL via INTRAVENOUS

## 2017-08-14 MED ORDER — FUROSEMIDE 10 MG/ML IJ SOLN
40.0000 mg | Freq: Once | INTRAMUSCULAR | Status: AC
  Administered 2017-08-14: 40 mg via INTRAVENOUS
  Filled 2017-08-14: qty 4

## 2017-08-14 NOTE — Progress Notes (Signed)
PROGRESS NOTE    Kirk Carson  [id number] DOB: 07-03-1950 DOA: 08/09/2017 PCP: Patient, No Pcp Per   Brief Narrative:  Pt. 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 Currently further plan is continue IV antibiotics and further work-up for the lung mass.     Assessment & Plan:   Principal Problem:   CAP (community acquired pneumonia) Active Problems:   Brain metastases ([place])   Hypertension   Tobacco use disorder   Cavitating mass in right upper lung lobe   Pneumonia of right upper lobe due to infectious organism ([place])  #1 community-acquired pneumonia versus postobstructive pneumonia Patient has been pancultured cultures pending.  Blood cultures negative.  Sputum cultures pending.  Continue empiric IV Rocephin and IV azithromycin, scheduled nebs, Pulmicort, supportive care.  We will give a dose of Lasix 40 mg IV x1 as patient noted to have some expiratory wheezing as well as rhonchorous breath sounds.  Could likely DC IV azithromycin tomorrow.  Follow.  2.  Primary lung mass/multiple metastases to the brain Patient was seen in consultation by pulmonary for possible biopsy and bronchoscopy however given the presence of multiple brain metastases a concern for hemorrhagic conversion it was felt that IR guided biopsy of chest nodule was a better option.  IR has been consulted.  Patient is status post ultrasound-guided biopsy with pathology showing poorly differentiated carcinoma.  Patient currently receiving radiation treatments.  Continue IV Decadron, IV Keppra. Hematology-oncology/neuro oncology/radiation oncology have been consulted.  Follow.  3.  Acute encephalopathy/dysphagia Secondary to brain metastases.  Sleeping however noted to be confused.  Continue IV steroids, IV Keppra for seizure prophylaxis.   Speech therapy following.  Diet currently been advanced to a regular diet.  Follow.  4.  Chronic diastolic dysfunction Patient with wheezing noted over the past 1 to 2 days on examination with some rhonchorous breath sounds.  We will give a dose of Lasix 40 mg IV x1.  Strict I's and O's.  Daily weights.  Follow.   5.  Hypertension Stable.    6.  Tobacco use Tobacco cessation.  Nicotine patch.     DVT prophylaxis: SCDs Code Status: Full Family Communication: Updated wife at bedside.   Disposition Plan: To be determined.   Consultants:   PCCM Dr Elsworth Soho 08/10/2017  Interventional radiology: Dr. Kathlene Cote 08/10/2017  Procedures:   CT angiogram chest 08/09/2017  CT head 08/01/2017  CXR 08/09/2017  Ultrasound-guided core biopsy of right chest wall mass 08/12/2017 per Dr. Kathlene Cote  Antimicrobials:   IV azithromycin 08/09/2017  IV Rocephin 08/09/2017   Subjective: Patient returned from radiation treatment and currently sleeping.  Objective: Vitals:   08/14/17 0932 08/14/17 1331 08/14/17 1504 08/14/17 1957  BP:  120/83    Pulse:  82    Resp:  14    Temp:  97.6 F (36.4 C)    TempSrc:  Oral    SpO2: 94% 98% 95% 93%  Weight:      Height:        Intake/Output Summary (Last 24 hours) at 08/14/2017 2009 Last data filed at 08/14/2017 1745 Gross per 24 hour  Intake 4246.6 ml  Output 2550 ml  Net 1696.6 ml   Filed Weights   08/11/17 0627 08/12/17 1600 08/14/17 0926  Weight: 70 kg (154 lb 5.2 oz) 69.9 kg (154 lb) 69.9 kg (154 lb 1.6  oz)    Examination:  General exam: NAD Respiratory system: Some coarse rhonchorous diffuse breath sounds anterior lung fields.  Fair air movement.  Decreased expiratory wheezing.  Cardiovascular system: RRR no murmurs rubs or gallops.  No pedal edema.  No JVD.  Gastrointestinal system: Abdomen is nontender, nondistended, soft, positive bowel sounds.  Central nervous system: Sleeping.  Moving extremities spontaneously.  Extremities: Symmetric 5  x 5 power. Skin: No rashes, lesions or ulcers Psychiatry: Judgement and insight appear poor. Mood & affect appropriate.     Data Reviewed: I have personally reviewed following labs and imaging studies  CBC: Recent Labs  Lab 08/09/17 1935 08/10/17 1129 08/11/17 0427 08/12/17 0409 08/13/17 0404 08/14/17 0425  WBC 9.3 9.1 11.7* 11.5* 9.9 11.7*  NEUTROABS 6.3  --  9.5* 9.2*  --   --   HGB 14.1 13.9 13.0 13.0 12.4* 12.6*  HCT 41.6 40.9 39.1 38.8* 36.9* 37.4*  MCV 97.2 97.6 97.5 97.2 97.4 97.4  PLT 215 262 295 277 261 643   Basic Metabolic Panel: Recent Labs  Lab 08/10/17 1129 08/11/17 0427 08/12/17 0409 08/13/17 0404 08/14/17 0425  NA 137 136 136 136 137  K 4.3 4.3 4.3 4.2 4.4  CL 104 105 105 106 107  CO2 21* 19* 18* 19* 20*  GLUCOSE 115* 124* 103* 110* 114*  BUN 8 12 14 18 19   CREATININE 0.77 0.72 0.77 0.68 0.77  CALCIUM 9.6 9.7 9.8 9.5 9.5  MG  --  2.0  --   --   --    GFR: Estimated Creatinine Clearance: 77.1 mL/min (by C-G formula based on SCr of 0.77 mg/dL). Liver Function Tests: Recent Labs  Lab 08/09/17 1935 08/10/17 1129 08/11/17 0427  AST 116* 73* 67*  ALT 53 35 33  ALKPHOS 103 92 86  BILITOT 1.7* 0.7 0.6  PROT 6.9 7.1 6.8  ALBUMIN 2.9* 3.0* 2.7*   No results for input(s): LIPASE, AMYLASE in the last 168 hours. No results for input(s): AMMONIA in the last 168 hours. Coagulation Profile: Recent Labs  Lab 08/11/17 0427 08/12/17 0409  INR 0.94 0.97   Cardiac Enzymes: No results for input(s): CKTOTAL, CKMB, CKMBINDEX, TROPONINI in the last 168 hours. BNP (last 3 results) No results for input(s): PROBNP in the last 8760 hours. HbA1C: No results for input(s): HGBA1C in the last 72 hours. CBG: No results for input(s): GLUCAP in the last 168 hours. Lipid Profile: No results for input(s): CHOL, HDL, LDLCALC, TRIG, CHOLHDL, LDLDIRECT in the last 72 hours. Thyroid Function Tests: No results for input(s): TSH, T4TOTAL, FREET4, T3FREE, THYROIDAB in  the last 72 hours. Anemia Panel: No results for input(s): VITAMINB12, FOLATE, FERRITIN, TIBC, IRON, RETICCTPCT in the last 72 hours. Sepsis Labs: Recent Labs  Lab 08/10/17 1129 08/11/17 0427 08/13/17 0404 08/14/17 0425  PROCALCITON 0.14  --   --   --   LATICACIDVEN 2.7* 3.1* 2.6* 2.8*    Recent Results (from the past 240 hour(s))  Blood Culture (routine x 2)     Status: None   Collection Time: 08/09/17 10:20 PM  Result Value Ref Range Status   Specimen Description BLOOD RIGHT ANTECUBITAL  Final   Special Requests   Final    BOTTLES DRAWN AEROBIC AND ANAEROBIC Blood Culture results may not be optimal due to an inadequate volume of blood received in culture bottles   Culture   Final    NO GROWTH 5 DAYS Performed at [facility] [place],  [place] [address]    Report Status 08/14/2017 FINAL  Final         Radiology Studies: No results found.      Scheduled Meds: . budesonide (PULMICORT) nebulizer solution  0.5 mg Nebulization BID  . dexamethasone  4 mg Intravenous Q6H  . guaiFENesin  600 mg Oral BID  . ipratropium-albuterol  3 mL Nebulization TID  . nicotine  21 mg Transdermal Daily  . pantoprazole  40 mg Oral Daily  . senna-docusate  1 tablet Oral QHS  . sodium chloride flush  3 mL Intravenous Q12H   Continuous Infusions: . sodium chloride 75 mL/hr at 08/14/17 1745  . azithromycin Stopped (08/13/17 2350)  . cefTRIAXone (ROCEPHIN)  IV Stopped (08/13/17 2220)  . levETIRAcetam Stopped (08/14/17 0948)  . ondansetron (ZOFRAN) IV       LOS: 4 days    Time spent: 35 mins    Irine Seal, MD [facility] Pager [phone]  If 7PM-7AM, please contact night-coverage [email or web address] Password [email or web address] 08/14/2017, 8:09 PM

## 2017-08-14 NOTE — Progress Notes (Signed)
CRITICAL VALUE ALERT  Critical Value:  2.8 Lactic Acid  Date & Time Notied: 0512 5/3  Provider Notified: [place]  Orders Received/Actions taken: Awaiting orders

## 2017-08-15 ENCOUNTER — Inpatient Hospital Stay (HOSPITAL_COMMUNITY): Payer: Self-pay

## 2017-08-15 LAB — BASIC METABOLIC PANEL
ANION GAP: 14 (ref 5–15)
BUN: 23 mg/dL — AB (ref 6–20)
CHLORIDE: 102 mmol/L (ref 101–111)
CO2: 20 mmol/L — ABNORMAL LOW (ref 22–32)
Calcium: 9.7 mg/dL (ref 8.9–10.3)
Creatinine, Ser: 0.81 mg/dL (ref 0.61–1.24)
GFR calc Af Amer: >60 mL/min (ref >60)
GFR calc non Af Amer: >60 mL/min (ref >60)
GLUCOSE: 127 mg/dL — AB (ref 65–99)
POTASSIUM: 3.7 mmol/L (ref 3.5–5.1)
Sodium: 136 mmol/L (ref 135–145)

## 2017-08-15 MED ORDER — GUAIFENESIN 100 MG/5ML PO SOLN
10.0000 mL | Freq: Two times a day (BID) | ORAL | Status: DC
  Administered 2017-08-16 – 2017-08-21 (×11): 200 mg via ORAL
  Filled 2017-08-15 (×10): qty 10
  Filled 2017-08-15: qty 20

## 2017-08-15 MED ORDER — FUROSEMIDE 10 MG/ML IJ SOLN
20.0000 mg | Freq: Once | INTRAMUSCULAR | Status: AC
  Administered 2017-08-15: 20 mg via INTRAVENOUS
  Filled 2017-08-15: qty 2

## 2017-08-15 NOTE — Progress Notes (Signed)
Modified Barium Swallow Progress Note  Patient Details  Name: Denali Becvar MRN: [id number] Date of Birth: 10-Oct-1950  Today's Date: 08/15/2017  Modified Barium Swallow completed.  Full report located under Chart Review in the Imaging Section.  Brief recommendations include the following:  Clinical Impression  Patient presents with a mild oropharyngeal dysphagia with likely impact from lethargy and current cognitive impairment. Patient exhibited swallow initiation delays to vallecular sinus with all tested boluses and two instances of swallow initiation delay to pyriform sinus with thin liquids. No residuals post swallow and no penetration or aspiration observed. Patient would periodically cough with a wet/congested sound but this occured in the absence of any observable boluses in oral or pharyngeal cavities. Based on report from spouse of patient "hiccuping" and near-vomitting after eating, suspect patient has an esophageal dysphagia.    Swallow Evaluation Recommendations   Recommended Consults: Consider GI evaluation;Consider esophageal assessment   SLP Diet Recommendations: Regular solids;Thin liquid   Liquid Administration via: Cup;Straw   Medication Administration: Whole meds with puree   Supervision: Full supervision/cueing for compensatory strategies   Compensations: Minimize environmental distractions;Slow rate;Small sips/bites   Postural Changes: Remain semi-upright after after feeds/meals (Comment);Seated upright at 90 degrees   Oral Care Recommendations: Oral care BID      Sonia Baller, MA, CCC-SLP 08/15/17 1:40 PM

## 2017-08-15 NOTE — Evaluation (Signed)
Clinical/Bedside Swallow Evaluation Patient Details  Name: Kirk Carson MRN: [id number] Date of Birth: [birth year]  Today's Date: 08/15/2017 Time: SLP Start Time (ACUTE ONLY): 0855 SLP Stop Time (ACUTE ONLY): 0915 SLP Time Calculation (min) (ACUTE ONLY): 20 min  Past Medical History:  Past Medical History:  Diagnosis Date  . 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 History of TIA (transient ischemic attack)   . Hyperlipidemia   . Hypertension   . Iron deficiency anemia   . Stroke [facility])    TIA 08/2013  . Tobacco abuse   . Unspecified constipation    Past Surgical History:  Past Surgical History:  Procedure Laterality Date  . TEE WITHOUT CARDIOVERSION N/A 08/24/2013   Procedure: TRANSESOPHAGEAL ECHOCARDIOGRAM (TEE);  Surgeon: Josue Hector, MD;  Location: [facility] ENDOSCOPY;  Service: Cardiovascular;  Laterality: N/A;   HPI:  67 yo male adm to [facility] with severe sepsis.  Pt PMH + for lung cancer, smoker, embolic cva 3267.  Pt found to have right upper pna suspected - Diffuse metastatic disease noted.  He has had increased falls/weakness recently and was admitted with increased SOB, cough.    Assessment / Plan / Recommendation Clinical Impression  Patient presents with mild-moderate oropharyngeal dysphagia with likely impact from current cognitive status (lethargic, confused). Patient exhibited delayed oral movement of puree bolus, and swallow initiation delays with both thin liquids and puree solids. Patient with good pharyngeal contraction and laryngeal elevation per palpation. Based on history, recommend MBS to r/o aspiration. SLP Visit Diagnosis: Dysphagia, unspecified (R13.10)    Aspiration Risk  Mild aspiration risk;Moderate aspiration risk    Diet Recommendation   TBD after MBS       Other  Recommendations     Follow up Recommendations Other (comment)(TBD pending progress)      Frequency and Duration min 2x/week  1 week       Prognosis Prognosis for  Safe Diet Advancement: Fair Barriers to Reach Goals: Cognitive deficits      Swallow Study   General Date of Onset: 08/10/17 HPI: 67 yo male adm to [facility] with severe sepsis.  Pt PMH + for lung cancer, smoker, embolic cva 1245.  Pt found to have right upper pna suspected - Diffuse metastatic disease noted.  He has had increased falls/weakness recently and was admitted with increased SOB, cough.  Type of Study: Bedside Swallow Evaluation Previous Swallow Assessment: BSE 4/29: rec for clear liquids (thin) Diet Prior to this Study: Regular;Thin liquids Temperature Spikes Noted: No Respiratory Status: Room air History of Recent Intubation: No Behavior/Cognition: Confused;Lethargic/Drowsy;Distractible Oral Cavity Assessment: Within Functional Limits Oral Care Completed by SLP: No Oral Cavity - Dentition: Edentulous Self-Feeding Abilities: Total assist Patient Positioning: Upright in bed Baseline Vocal Quality: Not observed Volitional Cough: Cognitively unable to elicit Volitional Swallow: Unable to elicit    Oral/Motor/Sensory Function Overall Oral Motor/Sensory Function: Mild impairment Facial Strength: Reduced right;Reduced left Lingual Strength: Reduced   Ice Chips Ice chips: Not tested   Thin Liquid Thin Liquid: Impaired Presentation: Straw Oral Phase Impairments: Reduced labial seal Pharyngeal  Phase Impairments: Suspected delayed Swallow    Nectar Thick Nectar Thick Liquid: Not tested   Honey Thick Honey Thick Liquid: Not tested   Puree Puree: Impaired Oral Phase Impairments: Poor awareness of bolus Oral Phase Functional Implications: Prolonged oral transit Pharyngeal Phase Impairments: Suspected delayed Swallow   Solid   GO   Solid: Not tested        Sonia Baller,  MA, CCC-SLP 08/15/17 11:14 AM

## 2017-08-15 NOTE — Progress Notes (Signed)
PROGRESS NOTE    Kirk Carson  [id number] DOB: 1950-12-15 DOA: 08/09/2017 PCP: Patient, No Pcp Per   Brief Narrative:  Pt. 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 Currently further plan is continue IV antibiotics and further work-up for the lung mass.     Assessment & Plan:   Principal Problem:   CAP (community acquired pneumonia) Active Problems:   Brain metastases ([place])   Hypertension   Tobacco use disorder   Cavitating mass in right upper lung lobe   Pneumonia of right upper lobe due to infectious organism ([place])  #1 community-acquired pneumonia versus postobstructive pneumonia Patient has been pancultured cultures pending.  Blood cultures negative.  Sputum cultures pending.  Continue empiric IV Rocephin and IV azithromycin, scheduled nebs, Pulmicort, supportive care.  Patient with good urine output after dose of Lasix 40 mg IV x1 with improvement with expiratory wheezing.  Discontinue IV azithromycin.  We will give another dose of Lasix 20 mg IV x1.   2.  Primary lung mass/multiple metastases to the brain Patient was seen in consultation by pulmonary for possible biopsy and bronchoscopy however given the presence of multiple brain metastases a concern for hemorrhagic conversion it was felt that IR guided biopsy of chest nodule was a better option.  IR has been consulted.  Patient is status post ultrasound-guided biopsy with pathology showing poorly differentiated carcinoma.  Patient currently receiving radiation treatments.  Continue IV Decadron, IV Keppra. Hematology-oncology/neuro oncology/radiation oncology have been consulted.  Will likely need to follow-up with oncology in the outpatient setting.  PT/OT pending.  Follow.  3.  Acute encephalopathy/dysphagia Secondary to brain metastases.   Sleeping.  Continue current regimen of IV Decadron, IV Keppra.  Suspicion for modified barium swallow and speech therapy following.  4.  Chronic diastolic dysfunction Patient with wheezing noted to 3 days ago with rhonchorous breath sounds.  Patient given IV Lasix with a urine output of 4.9 L over the past 24 hours.  Wheezing with some improvement.  Strict I's and O's.  Daily weights.  We will give a dose of Lasix 20 mg IV x1.    5.  Hypertension Stable.    6.  Tobacco use Tobacco cessation.  Continue nicotine patch.      DVT prophylaxis: SCDs Code Status: Full Family Communication: Updated sister at bedside.   Disposition Plan: To be determined.   Consultants:   PCCM Dr Elsworth Soho 08/10/2017  Interventional radiology: Dr. Kathlene Cote 08/10/2017  Procedures:   CT angiogram chest 08/09/2017  CT head 08/01/2017  CXR 08/09/2017  Ultrasound-guided core biopsy of right chest wall mass 08/12/2017 per Dr. Kathlene Cote  Antimicrobials:   IV azithromycin 08/09/2017>>>> 08/15/2017  IV Rocephin 08/09/2017   Subjective: Patient sleeping.  Family at bedside.    Objective: Vitals:   08/15/17 0714 08/15/17 0948 08/15/17 1354 08/15/17 1404  BP:    115/76  Pulse:    77  Resp:      Temp:    (!) 97.5 F (36.4 C)  TempSrc:    Oral  SpO2: 92% 93% 96%   Weight:    73.9 kg (162 lb 14.7 oz)  Height:        Intake/Output Summary (Last 24 hours) at 08/15/2017 1625 Last data filed at 08/15/2017 1504 Gross per 24 hour  Intake 2455 ml  Output 3300 ml  Net -845  ml   Filed Weights   08/12/17 1600 08/14/17 0926 08/15/17 1404  Weight: 69.9 kg (154 lb) 69.9 kg (154 lb 1.6 oz) 73.9 kg (162 lb 14.7 oz)    Examination:  General exam: NAD Respiratory system: Less coarse rhonchorous breath sounds.  Fair air movement.  Minimal expiratory wheezing.   Cardiovascular system: Regular rate and rhythm no murmurs rubs or gallops.  No pedal edema. Gastrointestinal system: Abdomen is soft, nontender, nondistended,  positive bowel sounds.  No rebound.  No guarding.  Central nervous system: Sleeping.  Moving extremities spontaneously.  Extremities: Symmetric 5 x 5 power. Skin: No rashes, lesions or ulcers Psychiatry: Judgement and insight appear poor. Mood & affect appropriate.     Data Reviewed: I have personally reviewed following labs and imaging studies  CBC: Recent Labs  Lab 08/09/17 1935 08/10/17 1129 08/11/17 0427 08/12/17 0409 08/13/17 0404 08/14/17 0425  WBC 9.3 9.1 11.7* 11.5* 9.9 11.7*  NEUTROABS 6.3  --  9.5* 9.2*  --   --   HGB 14.1 13.9 13.0 13.0 12.4* 12.6*  HCT 41.6 40.9 39.1 38.8* 36.9* 37.4*  MCV 97.2 97.6 97.5 97.2 97.4 97.4  PLT 215 262 295 277 261 086   Basic Metabolic Panel: Recent Labs  Lab 08/11/17 0427 08/12/17 0409 08/13/17 0404 08/14/17 0425 08/15/17 0414  NA 136 136 136 137 136  K 4.3 4.3 4.2 4.4 3.7  CL 105 105 106 107 102  CO2 19* 18* 19* 20* 20*  GLUCOSE 124* 103* 110* 114* 127*  BUN 12 14 18 19  23*  CREATININE 0.72 0.77 0.68 0.77 0.81  CALCIUM 9.7 9.8 9.5 9.5 9.7  MG 2.0  --   --   --   --    GFR: Estimated Creatinine Clearance: 83.7 mL/min (by C-G formula based on SCr of 0.81 mg/dL). Liver Function Tests: Recent Labs  Lab 08/09/17 1935 08/10/17 1129 08/11/17 0427  AST 116* 73* 67*  ALT 53 35 33  ALKPHOS 103 92 86  BILITOT 1.7* 0.7 0.6  PROT 6.9 7.1 6.8  ALBUMIN 2.9* 3.0* 2.7*   No results for input(s): LIPASE, AMYLASE in the last 168 hours. No results for input(s): AMMONIA in the last 168 hours. Coagulation Profile: Recent Labs  Lab 08/11/17 0427 08/12/17 0409  INR 0.94 0.97   Cardiac Enzymes: No results for input(s): CKTOTAL, CKMB, CKMBINDEX, TROPONINI in the last 168 hours. BNP (last 3 results) No results for input(s): PROBNP in the last 8760 hours. HbA1C: No results for input(s): HGBA1C in the last 72 hours. CBG: Recent Labs  Lab 08/14/17 2139  GLUCAP 119*   Lipid Profile: No results for input(s): CHOL, HDL,  LDLCALC, TRIG, CHOLHDL, LDLDIRECT in the last 72 hours. Thyroid Function Tests: No results for input(s): TSH, T4TOTAL, FREET4, T3FREE, THYROIDAB in the last 72 hours. Anemia Panel: No results for input(s): VITAMINB12, FOLATE, FERRITIN, TIBC, IRON, RETICCTPCT in the last 72 hours. Sepsis Labs: Recent Labs  Lab 08/10/17 1129 08/11/17 0427 08/13/17 0404 08/14/17 0425  PROCALCITON 0.14  --   --   --   LATICACIDVEN 2.7* 3.1* 2.6* 2.8*    Recent Results (from the past 240 hour(s))  Blood Culture (routine x 2)     Status: None   Collection Time: 08/09/17 10:20 PM  Result Value Ref Range Status   Specimen Description BLOOD RIGHT ANTECUBITAL  Final   Special Requests   Final    BOTTLES DRAWN AEROBIC AND ANAEROBIC Blood Culture results may not be optimal due to  an inadequate volume of blood received in culture bottles   Culture   Final    NO GROWTH 5 DAYS Performed at [facility], [place] [address], [place], [place] [address]    Report Status 08/14/2017 FINAL  Final         Radiology Studies: Dg Swallowing Func-speech Pathology  Result Date: 08/15/2017 Objective Swallowing Evaluation: Type of Study: MBS-Modified Barium Swallow Study  Patient Details Name: Kirk Carson MRN: [id number] Date of Birth: [birth year] Today's Date: 08/15/2017 Time: SLP Start Time (ACUTE ONLY): 31 -SLP Stop Time (ACUTE ONLY): 1140 SLP Time Calculation (min) (ACUTE ONLY): 10 min Past Medical History: Past Medical History: Diagnosis Date . 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 History of TIA (transient ischemic attack)  . Hyperlipidemia  . Hypertension  . Iron deficiency anemia  . Stroke [facility])   TIA 08/2013 . Tobacco abuse  . Unspecified constipation  Past Surgical History: Past Surgical History: Procedure Laterality Date . TEE WITHOUT CARDIOVERSION N/A 08/24/2013  Procedure: TRANSESOPHAGEAL ECHOCARDIOGRAM (TEE);  Surgeon: Josue Hector, MD;  Location: [facility]COPY;  Service: Cardiovascular;   Laterality: N/A; HPI: 67 yo male adm to [facility] with severe sepsis.  Pt PMH + for lung cancer, smoker, embolic cva 1540.  Pt found to have right upper pna suspected - Diffuse metastatic disease noted.  He has had increased falls/weakness recently and was admitted with increased SOB, cough.  Subjective: alert, cooperative, sitting up in chair in radiology suite Assessment / Plan / Recommendation CHL IP CLINICAL IMPRESSIONS 08/15/2017 Clinical Impression Patient presents with a mild oropharyngeal dysphagia with likely impact from lethargy and current cognitive impairment. Patient exhibited swallow initiation delays to vallecular sinus with all tested boluses and two instances of swallow initiation delay to pyriform sinus with thin liquids. No residuals post swallow and no penetration or aspiration observed. Patient would periodically cough with a wet/congested sound but this occured in the absence of any observable boluses in oral or pharyngeal cavities. Based on report from spouse of patient "hiccuping" and near-vomitting after eating, suspect patient has an esophageal dysphagia.  SLP Visit Diagnosis Dysphagia, oropharyngeal phase (R13.12) Attention and concentration deficit following -- Frontal lobe and executive function deficit following -- Impact on safety and function Mild aspiration risk   CHL IP TREATMENT RECOMMENDATION 08/15/2017 Treatment Recommendations Therapy as outlined in treatment plan below   Prognosis 08/15/2017 Prognosis for Safe Diet Advancement Good Barriers to Reach Goals Cognitive deficits Barriers/Prognosis Comment -- CHL IP DIET RECOMMENDATION 08/15/2017 SLP Diet Recommendations Regular solids;Thin liquid Liquid Administration via Cup;Straw Medication Administration Whole meds with puree Compensations Minimize environmental distractions;Slow rate;Small sips/bites Postural Changes Remain semi-upright after after feeds/meals (Comment);Seated upright at 90 degrees   CHL IP OTHER RECOMMENDATIONS 08/15/2017  Recommended Consults Consider GI evaluation;Consider esophageal assessment Oral Care Recommendations Oral care BID Other Recommendations --   CHL IP FOLLOW UP RECOMMENDATIONS 08/15/2017 Follow up Recommendations Other (comment)   CHL IP FREQUENCY AND DURATION 08/15/2017 Speech Therapy Frequency (ACUTE ONLY) min 2x/week Treatment Duration 1 week      CHL IP ORAL PHASE 08/15/2017 Oral Phase Impaired Oral - Pudding Teaspoon -- Oral - Pudding Cup -- Oral - Honey Teaspoon -- Oral - Honey Cup -- Oral - Nectar Teaspoon -- Oral - Nectar Cup -- Oral - Nectar Straw -- Oral - Thin Teaspoon -- Oral - Thin Cup -- Oral - Thin Straw -- Oral - Puree Delayed oral transit Oral - Mech Soft -- Oral - Regular Delayed oral  transit;Impaired mastication Oral - Multi-Consistency -- Oral - Pill Other (Comment) Oral Phase - Comment --  CHL IP PHARYNGEAL PHASE 08/15/2017 Pharyngeal Phase Impaired Pharyngeal- Pudding Teaspoon -- Pharyngeal -- Pharyngeal- Pudding Cup -- Pharyngeal -- Pharyngeal- Honey Teaspoon -- Pharyngeal -- Pharyngeal- Honey Cup -- Pharyngeal -- Pharyngeal- Nectar Teaspoon -- Pharyngeal -- Pharyngeal- Nectar Cup -- Pharyngeal -- Pharyngeal- Nectar Straw -- Pharyngeal -- Pharyngeal- Thin Teaspoon -- Pharyngeal -- Pharyngeal- Thin Cup Delayed swallow initiation-vallecula;Delayed swallow initiation-pyriform sinuses Pharyngeal -- Pharyngeal- Thin Straw Delayed swallow initiation-pyriform sinuses;Delayed swallow initiation-vallecula Pharyngeal -- Pharyngeal- Puree Delayed swallow initiation-vallecula Pharyngeal -- Pharyngeal- Mechanical Soft -- Pharyngeal -- Pharyngeal- Regular Delayed swallow initiation-vallecula Pharyngeal -- Pharyngeal- Multi-consistency -- Pharyngeal -- Pharyngeal- Pill -- Pharyngeal -- Pharyngeal Comment --  CHL IP CERVICAL ESOPHAGEAL PHASE 08/15/2017 Cervical Esophageal Phase WFL Pudding Teaspoon -- Pudding Cup -- Honey Teaspoon -- Honey Cup -- Nectar Teaspoon -- Nectar Cup -- Nectar Straw -- Thin Teaspoon -- Thin Cup  -- Thin Straw -- Puree -- Mechanical Soft -- Regular -- Multi-consistency -- Pill -- Cervical Esophageal Comment -- Sonia Baller, MA, CCC-SLP 08/15/17 12:58 PM                   Scheduled Meds: . budesonide (PULMICORT) nebulizer solution  0.5 mg Nebulization BID  . dexamethasone  4 mg Intravenous Q6H  . guaiFENesin  10 mL Oral BID  . ipratropium-albuterol  3 mL Nebulization TID  . nicotine  21 mg Transdermal Daily  . pantoprazole  40 mg Oral Daily  . senna-docusate  1 tablet Oral QHS  . sodium chloride flush  3 mL Intravenous Q12H   Continuous Infusions: . sodium chloride 75 mL/hr at 08/15/17 0747  . azithromycin Stopped (08/15/17 0002)  . cefTRIAXone (ROCEPHIN)  IV 2 g (08/14/17 2302)  . levETIRAcetam Stopped (08/15/17 1035)  . ondansetron (ZOFRAN) IV       LOS: 5 days    Time spent: 35 mins    Irine Seal, MD [facility] Pager [phone]  If 7PM-7AM, please contact night-coverage [email or web address] Password [email or web address] 08/15/2017, 4:25 PM

## 2017-08-16 LAB — CBC
HCT: 40.5 % (ref 39.0–52.0)
HEMOGLOBIN: 13.7 g/dL (ref 13.0–17.0)
MCH: 33.1 pg (ref 26.0–34.0)
MCHC: 33.8 g/dL (ref 30.0–36.0)
MCV: 97.8 fL (ref 78.0–100.0)
Platelets: 283 10*3/uL (ref 150–400)
RBC: 4.14 MIL/uL — ABNORMAL LOW (ref 4.22–5.81)
RDW: 13.2 % (ref 11.5–15.5)
WBC: 14.1 10*3/uL — AB (ref 4.0–10.5)

## 2017-08-16 LAB — BASIC METABOLIC PANEL
Anion gap: 14 (ref 5–15)
BUN: 26 mg/dL — AB (ref 6–20)
CO2: 21 mmol/L — ABNORMAL LOW (ref 22–32)
Calcium: 9.8 mg/dL (ref 8.9–10.3)
Chloride: 105 mmol/L (ref 101–111)
Creatinine, Ser: 0.86 mg/dL (ref 0.61–1.24)
GFR calc Af Amer: >60 mL/min (ref >60)
GFR calc non Af Amer: >60 mL/min (ref >60)
GLUCOSE: 104 mg/dL — AB (ref 65–99)
POTASSIUM: 4 mmol/L (ref 3.5–5.1)
Sodium: 140 mmol/L (ref 135–145)

## 2017-08-16 NOTE — Evaluation (Signed)
Occupational Therapy Evaluation Patient Details Name: Kirk Carson MRN: [id number] DOB: 01-22-1951 Today's Date: 08/16/2017    History of Present Illness Pt was admitted for CAP.  H/O CVA with no residual deficits, HTN, smoker. Found to have lung mass with mets to brain   Clinical Impression   This 67 year old man was admitted for the above.  Per chart, pt has had multiple falls; recent decline in function. Will follow in acute setting to increase participation in adls and mobility.  Supportive family present during OT session     Follow Up Recommendations  SNF;Supervision/Assistance - 24 hour    Equipment Recommendations  (possibly drop arm vs standard 3:1)    Recommendations for Other Services       Precautions / Restrictions Precautions Precautions: Fall Restrictions Weight Bearing Restrictions: No      Mobility Bed Mobility Overal bed mobility: Needs Assistance Bed Mobility: Supine to Sit     Supine to sit: Total assist;+2 for physical assistance     General bed mobility comments: pt resisted bending knee when I was going to have him roll. Wife initiated moving legs to side of bed; I assisted with trunk  Transfers Overall transfer level: Needs assistance Equipment used: 1 person hand held assist; 2nd person for safety Transfers: Sit to/from Kirk Carson Sit to Stand: Mod assist;+2 safety   Squat pivot transfers: Max assist+2 safety     General transfer comment: knees did not buckle.  Attempted SPT but pt had difficulty stepping, especially RLE. Assisted with weight shifting, pt took tiny steps then therapist assisted with squat pivot transfer    Balance Overall balance assessment: History of Falls;Needs assistance     Sitting balance - Comments: fair to poor sitting balance; fatiques easily.  Min guard at all times for safety                                   ADL either performed or assessed with clinical judgement   ADL  Overall ADL's : Needs assistance/impaired                                       General ADL Comments: total A for ADLs; +2 bed level for LB. Wife had been feeding pt when I arrived     Vision         Perception     Praxis      Pertinent Vitals/Pain Pain Assessment: Faces Faces Pain Scale: No hurt     Hand Dominance     Extremity/Trunk Assessment Upper Extremity Assessment Upper Extremity Assessment: Difficult to assess due to impaired cognition(used RUE to reach to face)           Communication Communication Communication: (little verbalization; family spoke to him in [place])   Cognition Arousal/Alertness: Awake/alert Behavior During Therapy: Flat affect Overall Cognitive Status: Difficult to assess                                 General Comments: did not really follow commands; resisted movement at times   General Comments  BP when sitting up in chair was 96/72.  sats 88-92% on RA    Exercises     Shoulder Instructions      Home Living Family/patient expects  to be discharged to:: Private residence Living Arrangements: Children Available Help at Discharge: Family                             Additional Comments: multiple family members in room including wife      Prior Functioning/Environment          Comments: pt recently needed help according to wife        OT Problem List: Decreased strength;Decreased activity tolerance;Impaired balance (sitting and/or standing);Decreased cognition;Decreased knowledge of use of DME or AE;Cardiopulmonary status limiting activity      OT Treatment/Interventions: Self-care/ADL training;Therapeutic exercise;DME and/or AE instruction;Therapeutic activities;Cognitive remediation/compensation;Patient/family education;Balance training    OT Goals(Current goals can be found in the care plan section) Acute Rehab OT Goals Patient Stated Goal: none stated; wife was happy that pt  was going to get OOB OT Goal Formulation: With family Time For Goal Achievement: 08/30/17 Potential to Achieve Goals: Fair ADL Goals Pt Will Transfer to Toilet: with min assist;bedside commode;stand pivot transfer;squat pivot transfer(vs drop arm) Additional ADL Goal #1: pt will perform simple grooming and self feeding tasks with min A seated Additional ADL Goal #2: pt will perform bed mobility with mod +1 assistance in preparation for adls Additional ADL Goal #3: pt will go from sit to stand wtih min A and maintain for one min at this level for adls Additional ADL Goal #4: pt will perform 2 sets of 5 AROM to bil UEs to increase strength for adls  OT Frequency: Min 2X/week   Barriers to D/C:            Co-evaluation              AM-PAC PT "6 Clicks" Daily Activity     Outcome Measure Help from another person eating meals?: Total Help from another person taking care of personal grooming?: Total Help from another person toileting, which includes using toliet, bedpan, or urinal?: Total Help from another person bathing (including washing, rinsing, drying)?: Total Help from another person to put on and taking off regular upper body clothing?: Total Help from another person to put on and taking off regular lower body clothing?: Total 6 Click Score: 6   End of Session Nurse Communication: Mobility status(stedy +2 vs squat pivot transfer)  Activity Tolerance: Patient limited by fatigue Patient left: in chair;with call bell/phone within reach;with chair alarm set  OT Visit Diagnosis: Muscle weakness (generalized) (M62.81)                Time: 9767-3419 OT Time Calculation (min): 35 min Charges:  OT General Charges $OT Visit: 1 Visit OT Evaluation $OT Eval Moderate Complexity: 1 Mod OT Treatments $Therapeutic Activity: 8-22 mins G-Codes:     [place], OTR/L [phone] 08/16/2017  Kirk Carson 08/16/2017, 10:17 AM

## 2017-08-16 NOTE — Progress Notes (Signed)
PROGRESS NOTE    Kirk Carson  [id number] DOB: September 26, 1950 DOA: 08/09/2017 PCP: Patient, No Pcp Per   Brief Narrative:  Pt. 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 Currently further plan is continue IV antibiotics and further work-up for the lung mass.     Assessment & Plan:   Principal Problem:   CAP (community acquired pneumonia) Active Problems:   Brain metastases ([place])   Hypertension   Tobacco use disorder   Cavitating mass in right upper lung lobe   Pneumonia of right upper lobe due to infectious organism ([place])  #1 community-acquired pneumonia versus postobstructive pneumonia Patient has been pancultured with blood cultures negative x5 days.  Sputum cultures with no growth.  Discontinue IV azithromycin.  Continue IV Rocephin, scheduled nebs, Pulmicort, supportive care.  Wheezing improved with diuresis.  Follow.   2.  Primary lung mass/multiple metastases to the brain Patient was seen in consultation by pulmonary for possible biopsy and bronchoscopy however given the presence of multiple brain metastases a concern for hemorrhagic conversion it was felt that IR guided biopsy of chest nodule was a better option.  IR has been consulted.  Patient is status post ultrasound-guided biopsy with pathology showing poorly differentiated carcinoma.  Patient currently receiving radiation treatments.  Continue IV Decadron, IV Keppra. Hematology-oncology/neuro oncology/radiation oncology have been consulted.  Will likely need to follow-up with oncology in the outpatient setting.  Needs skilled nursing facility on discharge.  Will need to transition from IV Decadron to oral Keppra in the next 24 hours.  PT/OT. Follow.  3.  Acute encephalopathy/dysphagia Secondary to brain metastases.  Sleeping but arousable however  drowsy..  Continue current regimen of IV Decadron, IV Keppra.  Continue current diet as per speech therapy recommendations.   4.  Chronic diastolic dysfunction Patient with wheezing noted to 4 days ago with rhonchorous breath sounds.  Patient given IV Lasix with a urine output of 4.9 L from 5/3-5/4 over the past 24 hours.  Wheezing improved with diuresis.  Patient with urine output of 2.650 L over the past 24 hours.  Strict I's and O's.  Daily weights.  Follow.   5.  Hypertension Stable.    6.  Tobacco use Tobacco cessation.  Nicotine patch.     DVT prophylaxis: SCDs Code Status: Full Family Communication: Updated wife at bedside.   Disposition Plan: Skilled nursing facility.    Consultants:   PCCM Dr Elsworth Soho 08/10/2017  Interventional radiology: Dr. Kathlene Cote 08/10/2017  Procedures:   CT angiogram chest 08/09/2017  CT head 08/01/2017  CXR 08/09/2017  Ultrasound-guided core biopsy of right chest wall mass 08/12/2017 per Dr. Kathlene Cote  Antimicrobials:   IV azithromycin 08/09/2017>>>> 08/15/2017  IV Rocephin 08/09/2017   Subjective: Patient sleeping however easily arousable but drowsy.  Patient feels shortness of breath improving.  Denies chest pain.  Wife at bedside.  Objective: Vitals:   08/16/17 0534 08/16/17 0805 08/16/17 1353 08/16/17 1410  BP: 119/70  114/70   Pulse: 72  79   Resp: (!) 22     Temp: (!) 97.5 F (36.4 C)  (!) 97.5 F (36.4 C)   TempSrc: Oral  Oral   SpO2: 95% 95% 98% 93%  Weight:      Height:        Intake/Output Summary (Last 24 hours) at 08/16/2017 1851 Last data filed at 08/16/2017  1300 Gross per 24 hour  Intake 200 ml  Output 2500 ml  Net -2300 ml   Filed Weights   08/12/17 1600 08/14/17 0926 08/15/17 1404  Weight: 69.9 kg (154 lb) 69.9 kg (154 lb 1.6 oz) 73.9 kg (162 lb 14.7 oz)    Examination:  General exam: NAD Respiratory system: Minimal expiratory wheezing.  Rhonchorous breath sounds improved.  Fair air movement. Cardiovascular  system: RRR no murmurs rubs or gallops.  No lower extremity edema.  Gastrointestinal system: Abdomen is nontender, nondistended, soft, positive bowel sounds.  No rebound.  No guarding.  Central nervous system: Drowsy.  Moving extremities spontaneously.  Extremities: Symmetric 5 x 5 power. Skin: No rashes, lesions or ulcers Psychiatry: Judgement and insight appear poor. Mood & affect appropriate.     Data Reviewed: I have personally reviewed following labs and imaging studies  CBC: Recent Labs  Lab 08/09/17 1935  08/11/17 0427 08/12/17 0409 08/13/17 0404 08/14/17 0425 08/16/17 0401  WBC 9.3   < > 11.7* 11.5* 9.9 11.7* 14.1*  NEUTROABS 6.3  --  9.5* 9.2*  --   --   --   HGB 14.1   < > 13.0 13.0 12.4* 12.6* 13.7  HCT 41.6   < > 39.1 38.8* 36.9* 37.4* 40.5  MCV 97.2   < > 97.5 97.2 97.4 97.4 97.8  PLT 215   < > 295 277 261 267 283   < > = values in this interval not displayed.   Basic Metabolic Panel: Recent Labs  Lab 08/11/17 0427 08/12/17 0409 08/13/17 0404 08/14/17 0425 08/15/17 0414 08/16/17 0401  NA 136 136 136 137 136 140  K 4.3 4.3 4.2 4.4 3.7 4.0  CL 105 105 106 107 102 105  CO2 19* 18* 19* 20* 20* 21*  GLUCOSE 124* 103* 110* 114* 127* 104*  BUN 12 14 18 19  23* 26*  CREATININE 0.72 0.77 0.68 0.77 0.81 0.86  CALCIUM 9.7 9.8 9.5 9.5 9.7 9.8  MG 2.0  --   --   --   --   --    GFR: Estimated Creatinine Clearance: 78.9 mL/min (by C-G formula based on SCr of 0.86 mg/dL). Liver Function Tests: Recent Labs  Lab 08/09/17 1935 08/10/17 1129 08/11/17 0427  AST 116* 73* 67*  ALT 53 35 33  ALKPHOS 103 92 86  BILITOT 1.7* 0.7 0.6  PROT 6.9 7.1 6.8  ALBUMIN 2.9* 3.0* 2.7*   No results for input(s): LIPASE, AMYLASE in the last 168 hours. No results for input(s): AMMONIA in the last 168 hours. Coagulation Profile: Recent Labs  Lab 08/11/17 0427 08/12/17 0409  INR 0.94 0.97   Cardiac Enzymes: No results for input(s): CKTOTAL, CKMB, CKMBINDEX, TROPONINI in the  last 168 hours. BNP (last 3 results) No results for input(s): PROBNP in the last 8760 hours. HbA1C: No results for input(s): HGBA1C in the last 72 hours. CBG: Recent Labs  Lab 08/14/17 2139  GLUCAP 119*   Lipid Profile: No results for input(s): CHOL, HDL, LDLCALC, TRIG, CHOLHDL, LDLDIRECT in the last 72 hours. Thyroid Function Tests: No results for input(s): TSH, T4TOTAL, FREET4, T3FREE, THYROIDAB in the last 72 hours. Anemia Panel: No results for input(s): VITAMINB12, FOLATE, FERRITIN, TIBC, IRON, RETICCTPCT in the last 72 hours. Sepsis Labs: Recent Labs  Lab 08/10/17 1129 08/11/17 0427 08/13/17 0404 08/14/17 0425  PROCALCITON 0.14  --   --   --   LATICACIDVEN 2.7* 3.1* 2.6* 2.8*    Recent Results (from the past  240 hour(s))  Blood Culture (routine x 2)     Status: None   Collection Time: 08/09/17 10:20 PM  Result Value Ref Range Status   Specimen Description BLOOD RIGHT ANTECUBITAL  Final   Special Requests   Final    BOTTLES DRAWN AEROBIC AND ANAEROBIC Blood Culture results may not be optimal due to an inadequate volume of blood received in culture bottles   Culture   Final    NO GROWTH 5 DAYS Performed at [facility], [place] [address] [place], [place] [address]    Report Status 08/14/2017 FINAL  Final         Radiology Studies: Dg Swallowing Func-speech Pathology  Result Date: 08/15/2017 Objective Swallowing Evaluation: Type of Study: MBS-Modified Barium Swallow Study  Patient Details Name: Attikus Bartoszek MRN: [id number] Date of Birth: 04-Mar-1951 Today's Date: 08/15/2017 Time: SLP Start Time (ACUTE ONLY): 52 -SLP Stop Time (ACUTE ONLY): 1140 SLP Time Calculation (min) (ACUTE ONLY): 10 min Past Medical History: Past Medical History: Diagnosis Date . 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 History of TIA (transient ischemic attack)  . Hyperlipidemia  . Hypertension  . Iron deficiency anemia  . Stroke [facility])   TIA 08/2013 . Tobacco abuse  .  Unspecified constipation  Past Surgical History: Past Surgical History: Procedure Laterality Date . TEE WITHOUT CARDIOVERSION N/A 08/24/2013  Procedure: TRANSESOPHAGEAL ECHOCARDIOGRAM (TEE);  Surgeon: Josue Hector, MD;  Location: [facility] ENDOSCOPY;  Service: Cardiovascular;  Laterality: N/A; HPI: 67 yo male adm to [facility] with severe sepsis.  Pt PMH + for lung cancer, smoker, embolic cva 0092.  Pt found to have right upper pna suspected - Diffuse metastatic disease noted.  He has had increased falls/weakness recently and was admitted with increased SOB, cough.  Subjective: alert, cooperative, sitting up in chair in radiology suite Assessment / Plan / Recommendation CHL IP CLINICAL IMPRESSIONS 08/15/2017 Clinical Impression Patient presents with a mild oropharyngeal dysphagia with likely impact from lethargy and current cognitive impairment. Patient exhibited swallow initiation delays to vallecular sinus with all tested boluses and two instances of swallow initiation delay to pyriform sinus with thin liquids. No residuals post swallow and no penetration or aspiration observed. Patient would periodically cough with a wet/congested sound but this occured in the absence of any observable boluses in oral or pharyngeal cavities. Based on report from spouse of patient "hiccuping" and near-vomitting after eating, suspect patient has an esophageal dysphagia.  SLP Visit Diagnosis Dysphagia, oropharyngeal phase (R13.12) Attention and concentration deficit following -- Frontal lobe and executive function deficit following -- Impact on safety and function Mild aspiration risk   CHL IP TREATMENT RECOMMENDATION 08/15/2017 Treatment Recommendations Therapy as outlined in treatment plan below   Prognosis 08/15/2017 Prognosis for Safe Diet Advancement Good Barriers to Reach Goals Cognitive deficits Barriers/Prognosis Comment -- CHL IP DIET RECOMMENDATION 08/15/2017 SLP Diet Recommendations Regular solids;Thin liquid Liquid Administration via  Cup;Straw Medication Administration Whole meds with puree Compensations Minimize environmental distractions;Slow rate;Small sips/bites Postural Changes Remain semi-upright after after feeds/meals (Comment);Seated upright at 90 degrees   CHL IP OTHER RECOMMENDATIONS 08/15/2017 Recommended Consults Consider GI evaluation;Consider esophageal assessment Oral Care Recommendations Oral care BID Other Recommendations --   CHL IP FOLLOW UP RECOMMENDATIONS 08/15/2017 Follow up Recommendations Other (comment)   CHL IP FREQUENCY AND DURATION 08/15/2017 Speech Therapy Frequency (ACUTE ONLY) min 2x/week Treatment Duration 1 week      CHL IP ORAL PHASE 08/15/2017 Oral Phase Impaired Oral - Pudding Teaspoon -- Oral -  Pudding Cup -- Oral - Honey Teaspoon -- Oral - Honey Cup -- Oral - Nectar Teaspoon -- Oral - Nectar Cup -- Oral - Nectar Straw -- Oral - Thin Teaspoon -- Oral - Thin Cup -- Oral - Thin Straw -- Oral - Puree Delayed oral transit Oral - Mech Soft -- Oral - Regular Delayed oral transit;Impaired mastication Oral - Multi-Consistency -- Oral - Pill Other (Comment) Oral Phase - Comment --  CHL IP PHARYNGEAL PHASE 08/15/2017 Pharyngeal Phase Impaired Pharyngeal- Pudding Teaspoon -- Pharyngeal -- Pharyngeal- Pudding Cup -- Pharyngeal -- Pharyngeal- Honey Teaspoon -- Pharyngeal -- Pharyngeal- Honey Cup -- Pharyngeal -- Pharyngeal- Nectar Teaspoon -- Pharyngeal -- Pharyngeal- Nectar Cup -- Pharyngeal -- Pharyngeal- Nectar Straw -- Pharyngeal -- Pharyngeal- Thin Teaspoon -- Pharyngeal -- Pharyngeal- Thin Cup Delayed swallow initiation-vallecula;Delayed swallow initiation-pyriform sinuses Pharyngeal -- Pharyngeal- Thin Straw Delayed swallow initiation-pyriform sinuses;Delayed swallow initiation-vallecula Pharyngeal -- Pharyngeal- Puree Delayed swallow initiation-vallecula Pharyngeal -- Pharyngeal- Mechanical Soft -- Pharyngeal -- Pharyngeal- Regular Delayed swallow initiation-vallecula Pharyngeal -- Pharyngeal- Multi-consistency --  Pharyngeal -- Pharyngeal- Pill -- Pharyngeal -- Pharyngeal Comment --  CHL IP CERVICAL ESOPHAGEAL PHASE 08/15/2017 Cervical Esophageal Phase WFL Pudding Teaspoon -- Pudding Cup -- Honey Teaspoon -- Honey Cup -- Nectar Teaspoon -- Nectar Cup -- Nectar Straw -- Thin Teaspoon -- Thin Cup -- Thin Straw -- Puree -- Mechanical Soft -- Regular -- Multi-consistency -- Pill -- Cervical Esophageal Comment -- Sonia Baller, MA, CCC-SLP 08/15/17 12:58 PM                   Scheduled Meds: . budesonide (PULMICORT) nebulizer solution  0.5 mg Nebulization BID  . dexamethasone  4 mg Intravenous Q6H  . guaiFENesin  10 mL Oral BID  . ipratropium-albuterol  3 mL Nebulization TID  . nicotine  21 mg Transdermal Daily  . pantoprazole  40 mg Oral Daily  . senna-docusate  1 tablet Oral QHS  . sodium chloride flush  3 mL Intravenous Q12H   Continuous Infusions: . cefTRIAXone (ROCEPHIN)  IV Stopped (08/15/17 2048)  . levETIRAcetam Stopped (08/16/17 1111)  . ondansetron (ZOFRAN) IV       LOS: 6 days    Time spent: 35 mins    Irine Seal, MD [facility] Pager [phone]  If 7PM-7AM, please contact night-coverage [email or web address] Password [facility] 08/16/2017, 6:51 PM

## 2017-08-16 NOTE — Progress Notes (Signed)
PT Cancellation Note  Patient Details Name: [name] MRN: [id number] DOB: 1950-10-12   Cancelled Treatment:    Reason Eval/Treat Not Completed: Other (comment)(pt recently transferred to recliner with OT, his wife would like him to stay in chair for awhile longer. +2 total assist to scoot back in chair. Pt didn't respond to questions/commands. Agree with OT recommendation for SNF. Will follow. )   Philomena Doheny 08/16/2017, 10:59 AM [phone]

## 2017-08-17 ENCOUNTER — Ambulatory Visit: Admit: 2017-08-17 | Payer: Self-pay

## 2017-08-17 LAB — CBC
HCT: 40.5 % (ref 39.0–52.0)
HEMOGLOBIN: 13.2 g/dL (ref 13.0–17.0)
MCH: 31.9 pg (ref 26.0–34.0)
MCHC: 32.6 g/dL (ref 30.0–36.0)
MCV: 97.8 fL (ref 78.0–100.0)
PLATELETS: 284 10*3/uL (ref 150–400)
RBC: 4.14 MIL/uL — ABNORMAL LOW (ref 4.22–5.81)
RDW: 13.6 % (ref 11.5–15.5)
WBC: 14.1 10*3/uL — ABNORMAL HIGH (ref 4.0–10.5)

## 2017-08-17 LAB — BASIC METABOLIC PANEL
Anion gap: 15 (ref 5–15)
BUN: 30 mg/dL — ABNORMAL HIGH (ref 6–20)
CALCIUM: 10 mg/dL (ref 8.9–10.3)
CO2: 20 mmol/L — AB (ref 22–32)
CREATININE: 0.85 mg/dL (ref 0.61–1.24)
Chloride: 107 mmol/L (ref 101–111)
GFR calc Af Amer: >60 mL/min (ref >60)
GFR calc non Af Amer: >60 mL/min (ref >60)
GLUCOSE: 115 mg/dL — AB (ref 65–99)
Potassium: 4.4 mmol/L (ref 3.5–5.1)
Sodium: 142 mmol/L (ref 135–145)

## 2017-08-17 NOTE — Evaluation (Signed)
Physical Therapy Evaluation Patient Details Name: Kirk Carson MRN: [id number] DOB: 09-Feb-1951 Today's Date: 08/17/2017   History of Present Illness  Pt was admitted for CAP.  H/O CVA with no residual deficits, HTN, smoker. Found to have lung mass with mets to brain  Clinical Impression  Pt admitted with above diagnosis. Pt currently with functional limitations due to the deficits listed below (see PT Problem List). +2 Mod assist to pivot to recliner. Pt sat on edge of bed x 22 minutes to work on dynamic sitting balance. Ability to follow commands fluctuates. Pt pleasant and puts forth good effort. Wife present, supportive, and translated into pt's primary language. Pt will benefit from skilled PT to increase their independence and safety with mobility to allow discharge to the venue listed below.       Follow Up Recommendations SNF;Supervision/Assistance - 24 hour    Equipment Recommendations  None recommended by PT    Recommendations for Other Services       Precautions / Restrictions Precautions Precautions: Fall Restrictions Weight Bearing Restrictions: No      Mobility  Bed Mobility Overal bed mobility: Needs Assistance Bed Mobility: Supine to Sit     Supine to sit: +2 for physical assistance;Max assist     General bed mobility comments: Max A to raise trunk  Transfers Overall transfer level: Needs assistance Equipment used: 2 person hand held assist Transfers: Sit to/from [facility] Sit to Stand: Mod assist;+2 physical assistance   Squat pivot transfers: Max assist;+2 physical assistance     General transfer comment: assist to advance RLE and to pivot to recliner  Ambulation/Gait                Stairs            Wheelchair Mobility    Modified Rankin (Stroke Patients Only)       Balance Overall balance assessment: History of Falls;Needs assistance Sitting-balance support: Feet supported;Single extremity  supported Sitting balance-Leahy Scale: Poor Sitting balance - Comments: initially poor, but after 1 minute fair; pt sat on EOB x 22 minutes and performed reaching activities, lateral weight shifts, leaning anterior/posteriorly with trunk, and trunk rotation AROM                                     Pertinent Vitals/Pain Pain Assessment: No/denies pain Faces Pain Scale: No hurt    Home Living Family/patient expects to be discharged to:: Skilled nursing facility Living Arrangements: Children;Spouse/significant other Available Help at Discharge: Family                  Prior Function Level of Independence: Independent         Comments: walked without AD at baseline, started to furniture walk PTA, info provided by wife     Hand Dominance        Extremity/Trunk Assessment   Upper Extremity Assessment Upper Extremity Assessment: Generalized weakness;Difficult to assess due to impaired cognition(RUE seems weaker than L, wife reports pt had CVA with R HP, pt able to actively elevate R shoulder ~40* during reaching activity)    Lower Extremity Assessment Lower Extremity Assessment: Generalized weakness;Difficult to assess due to impaired cognition(B knee ext +3/5)    Cervical / Trunk Assessment Cervical / Trunk Assessment: Normal  Communication   Communication: (little verbalization; family spoke to him in [place])  Cognition Arousal/Alertness: Awake/alert Behavior During Therapy: Flat affect Overall Cognitive  Status: Impaired/Different from baseline Area of Impairment: Following commands;Memory                       Following Commands: Follows one step commands inconsistently       General Comments: at times followed 1 step commands, others he didn't, English is 2nd language for pt, wife translated into primary language (she stated pt speaks 9 languages)      General Comments      Exercises     Assessment/Plan    PT Assessment  Patient needs continued PT services  PT Problem List Decreased strength;Decreased balance;Decreased activity tolerance;Decreased mobility       PT Treatment Interventions DME instruction;Functional mobility training;Therapeutic activities;Therapeutic exercise    PT Goals (Current goals can be found in the Care Plan section)  Acute Rehab PT Goals Patient Stated Goal: wife wanted pt to get up to recliner PT Goal Formulation: With family Time For Goal Achievement: 08/31/17 Potential to Achieve Goals: Fair    Frequency Min 2X/week   Barriers to discharge        Co-evaluation               AM-PAC PT "6 Clicks" Daily Activity  Outcome Measure Difficulty turning over in bed (including adjusting bedclothes, sheets and blankets)?: Unable Difficulty moving from lying on back to sitting on the side of the bed? : Unable Difficulty sitting down on and standing up from a chair with arms (e.g., wheelchair, bedside commode, etc,.)?: Unable Help needed moving to and from a bed to chair (including a wheelchair)?: A Lot Help needed walking in hospital room?: Total Help needed climbing 3-5 steps with a railing? : Total 6 Click Score: 7    End of Session Equipment Utilized During Treatment: Gait belt Activity Tolerance: Patient tolerated treatment well Patient left: in chair;with call bell/phone within reach;with chair alarm set Nurse Communication: Mobility status;Need for lift equipment PT Visit Diagnosis: Unsteadiness on feet (R26.81);Muscle weakness (generalized) (M62.81);Difficulty in walking, not elsewhere classified (R26.2)    Time: 6599-3570 PT Time Calculation (min) (ACUTE ONLY): 43 min   Charges:     PT Treatments $Therapeutic Exercise: 8-22 mins $Therapeutic Activity: 23-37 mins   PT G Codes:          Philomena Doheny 08/17/2017, 11:53 AM [phone]

## 2017-08-17 NOTE — Clinical Social Work Note (Signed)
Clinical Social Work Assessment  Patient Details  Name: Kirk Carson MRN: [id number] Date of Birth: [birth year]  Date of referral:  08/17/17               Reason for consult:  Facility Placement                Permission sought to share information with:  Family Supports Permission granted to share information::     Name::     wife Kirk Carson, daughter Advice worker::     Relationship::     Contact Information:     Housing/Transportation Living arrangements for the past 2 months:  Single Family Home Source of Information:  Spouse Patient Interpreter Needed:  Other (Comment Required)(wife states [place] is pt's primary language but that he understands/speaks English as well) Criminal Activity/Legal Involvement Pertinent to Current Situation/Hospitalization:  No - Comment as needed Significant Relationships:  Spouse, Adult Children Lives with:  Spouse Do you feel safe going back to the place where you live?  Yes Need for family participation in patient care:  Yes (Comment)(pt not highly interactive or oriented)  Care giving concerns:  Pt admitted from home where he resides with his wife and daughter. Wife reports pt recently retired and was completely independent prior to current illness. Arrived to [facility] 08/09/17 due to confusion and shortness of breath. Being treated for pneumonia, also was found to have malignant lung mass with metastasis to brain.  Wife reports in the weeks leading to admission pt was increasingly weak and confused.   Social Worker assessment / plan:  CSW consulted to assist with disposition plan as SNF/24-7 supervision was recommended per therapy sessions. Met with pt and wife at bedside. Pt did not participate and has not been very oriented throughout stay per chart. Wife reports current plan has been to have pt receive radiation, "we thought maybe he could go to rehab and maybe be stronger so he could come home after."  However, pt's wife states pt refused  radiation today and she is not sure what plan going forward will be.  Discussed that if SNF placement is a part of the plan of care, as pt is uninsured, coverage will be out of pocket. Wife states that they are living off savings while they look into whether or not pt qualifies for social security income (States she has forms to fill out- pt is [place] resident but not [place] citizen- immig[place] to [place] refugee from [place] in [place]). She states she feels if SNF is decided on (whether with hospice or for trial of rehab), she feels private pay would be acceptable but wishes to discuss plans with daughter once more of treatment goals are known. CSW will continue following to assist with disposition needs. Plan: TBD- possible SNF placement if appropriate depending on plan of care (hospice v radiation/continued treatment) and family's decision re: privately paying for SNF.    Employment status:  Retired Forensic scientist:  Self Pay (Medicaid Pending) PT Recommendations:  24 Hour Supervision, [place] / Referral to community resources:     Patient/Family's Response to care:  Pt's wife appreciative  Patient/Family's Understanding of and Emotional Response to Diagnosis, Current Treatment, and Prognosis:  Pt does not demonstrate understanding- does not interact. Wife shows some understanding however admits some of plan is unknown. Emotionally seems calm at time of assessment.   Emotional Assessment Appearance:  Appears stated age Attitude/Demeanor/Rapport:  (sleeping) Affect (typically observed):  Calm Orientation:  (UTA) Alcohol /  Substance use:  Not Applicable Psych involvement (Current and /or in the community):  No (Comment)  Discharge Needs  Concerns to be addressed:  Decision making concerns Readmission within the last 30 days:  No Current discharge risk:  (still assessing) Barriers to Discharge:  Continued Medical Work up   [employer],  LCSW 08/17/2017, 2:28 PM  [phone]

## 2017-08-17 NOTE — Progress Notes (Signed)
I spoke with the patient's daughter today. She states that her mother is in a better state and has her drinking under control at this time. That being said, I let her know that her dad refused treatment today, and has only had 1 fraction of radiotherapy to date. I stressed the importance of getting treatment if we have any chance to control the concerns for herniation. She understands the severity of what's going on. I also let her know I was going to reach out to palliative care to see him, as if he cannot or will not receive treatment, he will most likely benefit from Hospice. She understands that this could still be the case because of the nature of what's going on.     Carola Rhine, PAC

## 2017-08-17 NOTE — Progress Notes (Signed)
PROGRESS NOTE    Kirk Carson  [id number] DOB: [birth year] DOA: 08/09/2017 PCP: Patient, No Pcp Per   Brief Narrative:  Pt. 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 Currently further plan is continue IV antibiotics and further work-up for the lung mass.     Assessment & Plan:   Principal Problem:   CAP (community acquired pneumonia) Active Problems:   Brain metastases ([place])   Hypertension   Tobacco use disorder   Cavitating mass in right upper lung lobe   Pneumonia of right upper lobe due to infectious organism ([place])  #1 community-acquired pneumonia versus postobstructive pneumonia Patient has been pancultured with blood cultures negative x5 days.  Sputum cultures with no growth.  Discontinued IV azithromycin.  Continue IV Rocephin after today's dose.  Continue scheduled nebs, Pulmicort, supportive care.  Wheezing improved with diuresis.  Follow.   2.  Primary lung mass/multiple metastases to the brain Patient was seen in consultation by pulmonary for possible biopsy and bronchoscopy however given the presence of multiple brain metastases a concern for hemorrhagic conversion it was felt that IR guided biopsy of chest nodule was a better option.  IR has been consulted.  Patient is status post ultrasound-guided biopsy with pathology showing poorly differentiated carcinoma.  Patient currently receiving radiation treatments.  Continue IV Decadron, IV Keppra. Hematology-oncology/neuro oncology/radiation oncology have been consulted.  Will likely need to follow-up with oncology in the outpatient setting.  Needs skilled nursing facility on discharge.  Will need to transition from IV Decadron to oral Decadron in the next 24 hours.  Will transition from IV Keppra to oral Keppra as well.  PT/OT. Follow.  3.  Acute  encephalopathy/dysphagia Secondary to brain metastases.  Patient sleeping.  Arousable.  Continue IV Decadron, IV Keppra.  Continue current diet.    4.  Chronic diastolic dysfunction Patient with wheezing noted to 4 days ago with rhonchorous breath sounds.  Patient given IV Lasix with a urine output of 4.9 L from 5/3-5/4 over the past 24 hours.  Wheezing improved with diuresis.  Patient with urine output of 2.650 L over the past 24 hours.  Strict I's and O's.  Daily weights.  Follow.   5.  Hypertension Stable.    6.  Tobacco use Tobacco cessation.  Continue nicotine patch.      DVT prophylaxis: SCDs Code Status: Full Family Communication: Updated wife at bedside.   Disposition Plan: Skilled nursing facility when okay with radiation oncology hopefully in the next 24 to 48 hours..    Consultants:   PCCM Dr Elsworth Soho 08/10/2017  Interventional radiology: Dr. Kathlene Cote 08/10/2017  Procedures:   CT angiogram chest 08/09/2017  CT head 08/01/2017  CXR 08/09/2017  Ultrasound-guided core biopsy of right chest wall mass 08/12/2017 per Dr. Kathlene Cote  Antimicrobials:   IV azithromycin 08/09/2017>>>> 08/15/2017  IV Rocephin 08/09/2017 >>>>>>08/17/2017   Subjective: Patient sleeping, easily arousable.  Went for radiation treatments however unable to be done and will be done tomorrow per wife.  Denies any chest pain.  No nausea vomiting.  Per wife has to be coaxed to eat.    Objective: Vitals:   08/16/17 1410 08/16/17 1951 08/17/17 0512 08/17/17 0836  BP:   113/79   Pulse:   88 86  Resp:   20 18  Temp:   99.5 F (37.5 C)   TempSrc:  Oral   SpO2: 93% 96% 96% 94%  Weight:   68.5 kg (151 lb 0.2 oz)   Height:        Intake/Output Summary (Last 24 hours) at 08/17/2017 1259 Last data filed at 08/17/2017 0900 Gross per 24 hour  Intake 300 ml  Output 900 ml  Net -600 ml   Filed Weights   08/14/17 0926 08/15/17 1404 08/17/17 0512  Weight: 69.9 kg (154 lb 1.6 oz) 73.9 kg (162 lb 14.7 oz) 68.5 kg  (151 lb 0.2 oz)    Examination:  General exam: NAD. Respiratory system: Less rhonchorus BS. Minimal expiratory wheezing.  Fair air movement. Cardiovascular system: Regular rate rhythm no murmurs rubs or gallops.  No lower extremity edema.  Gastrointestinal system: Abdomen is soft, nontender, nondistended, positive bowel sounds.  No rebound.  No guarding.  Central nervous system: Less drowsy.  Moving extremities spontaneously.  Extremities: Symmetric 5 x 5 power. Skin: No rashes, lesions or ulcers Psychiatry: Judgement and insight appear poor. Mood & affect appropriate.     Data Reviewed: I have personally reviewed following labs and imaging studies  CBC: Recent Labs  Lab 08/11/17 0427 08/12/17 0409 08/13/17 0404 08/14/17 0425 08/16/17 0401 08/17/17 0426  WBC 11.7* 11.5* 9.9 11.7* 14.1* 14.1*  NEUTROABS 9.5* 9.2*  --   --   --   --   HGB 13.0 13.0 12.4* 12.6* 13.7 13.2  HCT 39.1 38.8* 36.9* 37.4* 40.5 40.5  MCV 97.5 97.2 97.4 97.4 97.8 97.8  PLT 295 277 261 267 283 893   Basic Metabolic Panel: Recent Labs  Lab 08/11/17 0427  08/13/17 0404 08/14/17 0425 08/15/17 0414 08/16/17 0401 08/17/17 0426  NA 136   < > 136 137 136 140 142  K 4.3   < > 4.2 4.4 3.7 4.0 4.4  CL 105   < > 106 107 102 105 107  CO2 19*   < > 19* 20* 20* 21* 20*  GLUCOSE 124*   < > 110* 114* 127* 104* 115*  BUN 12   < > 18 19 23* 26* 30*  CREATININE 0.72   < > 0.68 0.77 0.81 0.86 0.85  CALCIUM 9.7   < > 9.5 9.5 9.7 9.8 10.0  MG 2.0  --   --   --   --   --   --    < > = values in this interval not displayed.   GFR: Estimated Creatinine Clearance: 72.5 mL/min (by C-G formula based on SCr of 0.85 mg/dL). Liver Function Tests: Recent Labs  Lab 08/11/17 0427  AST 67*  ALT 33  ALKPHOS 86  BILITOT 0.6  PROT 6.8  ALBUMIN 2.7*   No results for input(s): LIPASE, AMYLASE in the last 168 hours. No results for input(s): AMMONIA in the last 168 hours. Coagulation Profile: Recent Labs  Lab  08/11/17 0427 08/12/17 0409  INR 0.94 0.97   Cardiac Enzymes: No results for input(s): CKTOTAL, CKMB, CKMBINDEX, TROPONINI in the last 168 hours. BNP (last 3 results) No results for input(s): PROBNP in the last 8760 hours. HbA1C: No results for input(s): HGBA1C in the last 72 hours. CBG: Recent Labs  Lab 08/14/17 2139  GLUCAP 119*   Lipid Profile: No results for input(s): CHOL, HDL, LDLCALC, TRIG, CHOLHDL, LDLDIRECT in the last 72 hours. Thyroid Function Tests: No results for input(s): TSH, T4TOTAL, FREET4, T3FREE, THYROIDAB in the last 72 hours. Anemia Panel: No results for input(s): VITAMINB12, FOLATE, FERRITIN, TIBC, IRON, RETICCTPCT in the last  72 hours. Sepsis Labs: Recent Labs  Lab 08/11/17 0427 08/13/17 0404 08/14/17 0425  LATICACIDVEN 3.1* 2.6* 2.8*    Recent Results (from the past 240 hour(s))  Blood Culture (routine x 2)     Status: None   Collection Time: 08/09/17 10:20 PM  Result Value Ref Range Status   Specimen Description BLOOD RIGHT ANTECUBITAL  Final   Special Requests   Final    BOTTLES DRAWN AEROBIC AND ANAEROBIC Blood Culture results may not be optimal due to an inadequate volume of blood received in culture bottles   Culture   Final    NO GROWTH 5 DAYS Performed at [facility], [place] [address], [place], [place] [address]    Report Status 08/14/2017 FINAL  Final         Radiology Studies: No results found.      Scheduled Meds: . budesonide (PULMICORT) nebulizer solution  0.5 mg Nebulization BID  . dexamethasone  4 mg Intravenous Q6H  . guaiFENesin  10 mL Oral BID  . ipratropium-albuterol  3 mL Nebulization TID  . nicotine  21 mg Transdermal Daily  . pantoprazole  40 mg Oral Daily  . senna-docusate  1 tablet Oral QHS  . sodium chloride flush  3 mL Intravenous Q12H   Continuous Infusions: . cefTRIAXone (ROCEPHIN)  IV Stopped (08/16/17 2103)  . levETIRAcetam 500 mg (08/17/17 1150)  . ondansetron (ZOFRAN) IV       LOS: 7  days    Time spent: 35 mins    Irine Seal, MD [facility] Pager [phone]  If 7PM-7AM, please contact night-coverage [email or web address] Password [email or web address] 08/17/2017, 12:59 PM

## 2017-08-18 ENCOUNTER — Encounter: Payer: Self-pay | Admitting: *Deleted

## 2017-08-18 ENCOUNTER — Ambulatory Visit
Admit: 2017-08-18 | Discharge: 2017-08-18 | Disposition: A | Discharge status: Home / Self Care | Payer: Self-pay | Attending: Radiation Oncology | Admitting: Radiation Oncology

## 2017-08-18 LAB — BASIC METABOLIC PANEL
ANION GAP: 14 (ref 5–15)
BUN: 28 mg/dL — ABNORMAL HIGH (ref 6–20)
CHLORIDE: 107 mmol/L (ref 101–111)
CO2: 20 mmol/L — AB (ref 22–32)
Calcium: 10.2 mg/dL (ref 8.9–10.3)
Creatinine, Ser: 0.79 mg/dL (ref 0.61–1.24)
GFR calc non Af Amer: >60 mL/min (ref >60)
Glucose, Bld: 103 mg/dL — ABNORMAL HIGH (ref 65–99)
POTASSIUM: 4 mmol/L (ref 3.5–5.1)
Sodium: 141 mmol/L (ref 135–145)

## 2017-08-18 MED ORDER — LEVETIRACETAM 100 MG/ML PO SOLN
500.0000 mg | Freq: Two times a day (BID) | ORAL | Status: DC
  Administered 2017-08-18 – 2017-08-21 (×6): 500 mg via ORAL
  Filled 2017-08-18 (×6): qty 5

## 2017-08-18 NOTE — Progress Notes (Signed)
PROGRESS NOTE    Kirk Carson  [id number] DOB: 1950-11-15 DOA: 08/09/2017 PCP: Patient, No Pcp Per   Brief Narrative:  Pt. 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 Currently further plan is continue IV antibiotics and further work-up for the lung mass.     Assessment & Plan:   Principal Problem:   CAP (community acquired pneumonia) Active Problems:   Brain metastases ([place])   Hypertension   Tobacco use disorder   Cavitating mass in right upper lung lobe   Pneumonia of right upper lobe due to infectious organism ([place])  #1 community-acquired pneumonia versus postobstructive pneumonia Patient has been pancultured with blood cultures negative x5 days.  Sputum cultures with no growth.  Discontinued IV azithromycin.  Continue IV Rocephin after today's dose.  Continue scheduled nebs, Pulmicort, supportive care.  Wheezing improved with diuresis.  Follow.   2.  Primary lung mass/multiple metastases to the brain Patient was seen in consultation by pulmonary for possible biopsy and bronchoscopy however given the presence of multiple brain metastases a concern for hemorrhagic conversion it was felt that IR guided biopsy of chest nodule was a better option.  IR has been consulted.  Patient is status post ultrasound-guided biopsy with pathology showing poorly differentiated carcinoma.  Patient currently receiving radiation treatments.  Continue IV Decadron, IV Keppra. Hematology-oncology/neuro oncology/radiation oncology have been consulted.  Will likely need to follow-up with oncology in the outpatient setting.  Needs skilled nursing facility on discharge.  Will need to transition from IV Decadron to oral Decadron in the next 24 hours.  Will transition from IV Keppra to oral Keppra as well.  PT/OT. Follow.  3.  Acute  encephalopathy/dysphagia Secondary to brain metastases.  Patient somewhat confused.  Continue current regimen of IV Decadron and IV Keppra and transition to oral medications as oral intake improves. Continue current diet.    4.  Chronic diastolic dysfunction Patient with wheezing noted to 5-6 days ago with rhonchorous breath sounds.  Patient given IV Lasix with a urine output of 4.9 L from 5/3-5/4.  Wheezing improved with diuresis.  Patient not on diuretics at home.  Follow for now.     5.  Hypertension Stable.    6.  Tobacco use Tobacco cessation.  Continue nicotine patch.      DVT prophylaxis: SCDs Code Status: Full Family Communication: Updated wife at bedside.   Disposition Plan: Skilled nursing facility when okay with radiation oncology hopefully in the next 24 to 48 hours..    Consultants:   PCCM Dr Elsworth Soho 08/10/2017  Interventional radiology: Dr. Kathlene Cote 08/10/2017  Palliative care pending  Procedures:   CT angiogram chest 08/09/2017  CT head 08/01/2017  CXR 08/09/2017  Ultrasound-guided core biopsy of right chest wall mass 08/12/2017 per Dr. Kathlene Cote  Antimicrobials:   IV azithromycin 08/09/2017>>>> 08/15/2017  IV Rocephin 08/09/2017 >>>>>>08/17/2017   Subjective: Patient on the way to radiation treatments.  Per wife some improvement with shortness of breath.  Patient somewhat confused.  Denies chest pain.  Objective: Vitals:   08/18/17 0438 08/18/17 0751 08/18/17 0754 08/18/17 0755  BP: 91/70   110/75  Pulse: 92   92  Resp: 18   20  Temp: 98.2 F (36.8 C)   97.7 F (36.5 C)  TempSrc: Oral   Oral  SpO2: 97% 95% 96% 100%  Weight: 64.6 kg (142  lb 6.7 oz)     Height:        Intake/Output Summary (Last 24 hours) at 08/18/2017 1248 Last data filed at 08/18/2017 6644 Gross per 24 hour  Intake 200 ml  Output -  Net 200 ml   Filed Weights   08/15/17 1404 08/17/17 0512 08/18/17 0438  Weight: 73.9 kg (162 lb 14.7 oz) 68.5 kg (151 lb 0.2 oz) 64.6 kg (142 lb 6.7 oz)      Examination:  General exam: NAD. Respiratory system: Lungs clear to auscultation bilaterally.  No wheezes, decreased rhonchi.  No crackles.  Fair air movement. Cardiovascular system: RRR no murmurs rubs or gallops.  No lower extremity edema.  Gastrointestinal system: Abdomen is nontender, nondistended, soft, positive bowel sounds.  No rebound.  No guarding.  Central nervous system: Less drowsy.  Moving extremities spontaneously.  Extremities: Symmetric 5 x 5 power. Skin: No rashes, lesions or ulcers Psychiatry: Judgement and insight appear poor. Mood & affect appropriate.     Data Reviewed: I have personally reviewed following labs and imaging studies  CBC: Recent Labs  Lab 08/12/17 0409 08/13/17 0404 08/14/17 0425 08/16/17 0401 08/17/17 0426  WBC 11.5* 9.9 11.7* 14.1* 14.1*  NEUTROABS 9.2*  --   --   --   --   HGB 13.0 12.4* 12.6* 13.7 13.2  HCT 38.8* 36.9* 37.4* 40.5 40.5  MCV 97.2 97.4 97.4 97.8 97.8  PLT 277 261 267 283 034   Basic Metabolic Panel: Recent Labs  Lab 08/14/17 0425 08/15/17 0414 08/16/17 0401 08/17/17 0426 08/18/17 0707  NA 137 136 140 142 141  K 4.4 3.7 4.0 4.4 4.0  CL 107 102 105 107 107  CO2 20* 20* 21* 20* 20*  GLUCOSE 114* 127* 104* 115* 103*  BUN 19 23* 26* 30* 28*  CREATININE 0.77 0.81 0.86 0.85 0.79  CALCIUM 9.5 9.7 9.8 10.0 10.2   GFR: Estimated Creatinine Clearance: 77.1 mL/min (by C-G formula based on SCr of 0.79 mg/dL). Liver Function Tests: No results for input(s): AST, ALT, ALKPHOS, BILITOT, PROT, ALBUMIN in the last 168 hours. No results for input(s): LIPASE, AMYLASE in the last 168 hours. No results for input(s): AMMONIA in the last 168 hours. Coagulation Profile: Recent Labs  Lab 08/12/17 0409  INR 0.97   Cardiac Enzymes: No results for input(s): CKTOTAL, CKMB, CKMBINDEX, TROPONINI in the last 168 hours. BNP (last 3 results) No results for input(s): PROBNP in the last 8760 hours. HbA1C: No results for input(s):  HGBA1C in the last 72 hours. CBG: Recent Labs  Lab 08/14/17 2139  GLUCAP 119*   Lipid Profile: No results for input(s): CHOL, HDL, LDLCALC, TRIG, CHOLHDL, LDLDIRECT in the last 72 hours. Thyroid Function Tests: No results for input(s): TSH, T4TOTAL, FREET4, T3FREE, THYROIDAB in the last 72 hours. Anemia Panel: No results for input(s): VITAMINB12, FOLATE, FERRITIN, TIBC, IRON, RETICCTPCT in the last 72 hours. Sepsis Labs: Recent Labs  Lab 08/13/17 0404 08/14/17 0425  LATICACIDVEN 2.6* 2.8*    Recent Results (from the past 240 hour(s))  Blood Culture (routine x 2)     Status: None   Collection Time: 08/09/17 10:20 PM  Result Value Ref Range Status   Specimen Description BLOOD RIGHT ANTECUBITAL  Final   Special Requests   Final    BOTTLES DRAWN AEROBIC AND ANAEROBIC Blood Culture results may not be optimal due to an inadequate volume of blood received in culture bottles   Culture   Final    NO GROWTH 5  DAYS Performed at [facility], [place] [address] [place], [place] [address]    Report Status 08/14/2017 FINAL  Final         Radiology Studies: No results found.      Scheduled Meds: . budesonide (PULMICORT) nebulizer solution  0.5 mg Nebulization BID  . dexamethasone  4 mg Intravenous Q6H  . guaiFENesin  10 mL Oral BID  . ipratropium-albuterol  3 mL Nebulization TID  . nicotine  21 mg Transdermal Daily  . pantoprazole  40 mg Oral Daily  . senna-docusate  1 tablet Oral QHS  . sodium chloride flush  3 mL Intravenous Q12H   Continuous Infusions: . levETIRAcetam 500 mg (08/18/17 0935)  . ondansetron (ZOFRAN) IV       LOS: 8 days    Time spent: 35 mins    Irine Seal, MD [facility] Pager [phone]  If 7PM-7AM, please contact night-coverage [email or web address] Password [facility] 08/18/2017, 12:48 PM

## 2017-08-18 NOTE — Progress Notes (Signed)
Occupational Therapy Treatment Patient Details Name: Kirk Carson MRN: [id number] DOB: Mar 02, 1951 Today's Date: 08/18/2017    History of present illness Pt was admitted for CAP.  H/O CVA with no residual deficits, HTN, smoker. Found to have lung mass with mets to brain   OT comments  Pt requiring max-total assist +2 for bed mobility this session, maintaining sitting balance with overall min-modA, intermittently able to maintain with MinGuard. Pt continues to require total assist for all ADL completion. Feel POC remains appropriate at this time. Will continue to follow acutely to progress pt towards established OT goals.   Follow Up Recommendations  SNF;Supervision/Assistance - 24 hour    Equipment Recommendations  Other (comment)(drop arm vs standard 3:1 )          Precautions / Restrictions Precautions Precautions: Fall Restrictions Weight Bearing Restrictions: No       Mobility Bed Mobility Overal bed mobility: Needs Assistance Bed Mobility: Supine to Sit;Sit to Supine     Supine to sit: Total assist;+2 for physical assistance Sit to supine: Max assist   General bed mobility comments: assist to bring LEs over EOB and to elevate trunk; assist to bring LEs onto EOB when returning to supine                        Balance Overall balance assessment: History of Falls;Needs assistance Sitting-balance support: Feet supported;Single extremity supported Sitting balance-Leahy Scale: Poor Sitting balance - Comments: pt intermittently able to maintain sitting EOB with MinGuard assist; overall requiring min-modA to maintain sitting balance                                    ADL either performed or assessed with clinical judgement   ADL Overall ADL's : Needs assistance/impaired                                       General ADL Comments: pt continues to require total A for ADLs; +2 bed level for LB. Pt minimally following simple one  step commands                        Cognition Arousal/Alertness: Lethargic Behavior During Therapy: Flat affect Overall Cognitive Status: Impaired/Different from baseline Area of Impairment: Following commands;Memory                       Following Commands: Follows one step commands inconsistently       General Comments: minimally following 1 step commands, requires cues to keep eyes open                           Pertinent Vitals/ Pain       Pain Assessment: Faces Faces Pain Scale: No hurt                                                          Frequency  Min 2X/week        Progress Toward Goals  OT Goals(current goals can now be found in the care plan section)  Progress towards OT goals: Progressing toward goals  Acute Rehab OT Goals Patient Stated Goal: none stated  OT Goal Formulation: Patient unable to participate in goal setting Time For Goal Achievement: 08/30/17 Potential to Achieve Goals: [place] Discharge plan remains appropriate                     AM-PAC PT "6 Clicks" Daily Activity     Outcome Measure   Help from another person eating meals?: Total Help from another person taking care of personal grooming?: Total Help from another person toileting, which includes using toliet, bedpan, or urinal?: Total Help from another person bathing (including washing, rinsing, drying)?: Total Help from another person to put on and taking off regular upper body clothing?: Total Help from another person to put on and taking off regular lower body clothing?: Total 6 Click Score: 6    End of Session    OT Visit Diagnosis: Muscle weakness (generalized) (M62.81)   Activity Tolerance Patient limited by fatigue   Patient Left in bed;with call bell/phone within reach;with bed alarm set;with family/visitor present   Nurse Communication Mobility status        Time: 9379-0240 OT Time Calculation  (min): 23 min  Charges: OT General Charges $OT Visit: 1 Visit OT Treatments $Therapeutic Activity: 8-22 mins  Kirk Carson, OT Pager [phone] 08/18/2017    Kirk Carson 08/18/2017, 1:05 PM

## 2017-08-18 NOTE — Progress Notes (Signed)
Oncology Nurse Navigator Documentation  Oncology Nurse Navigator Flowsheets 08/18/2017  Navigator Location [facility]  Navigator Encounter Type Other/I followed up on Kirk Carson schedule. He is still in the hospital.  Per Dr. Julien Nordmann, I will complete a scheduling message to arrange an appt for patient to be seen in 3 weeks. I did request molecular testing on 08/17/17.    Treatment Phase Treatment  Barriers/Navigation Needs Coordination of Care  Interventions Coordination of Care  Coordination of Care Other  Acuity Level 2  Time Spent with Patient 30

## 2017-08-18 NOTE — Progress Notes (Signed)
Palliative:  I have arranged to meet with Mr. Sanderson daughter and wife tomorrow 5/8 at 1200 noon. Thank you for this consult.   No charge  Vinie Sill, NP Palliative Medicine Team Pager # [phone] (M-F 8a-5p) Team Phone # [phone] (Nights/Weekends)

## 2017-08-19 ENCOUNTER — Ambulatory Visit
Admit: 2017-08-19 | Discharge: 2017-08-19 | Disposition: A | Discharge status: Home / Self Care | Payer: Self-pay | Attending: Radiation Oncology | Admitting: Radiation Oncology

## 2017-08-19 ENCOUNTER — Telehealth: Payer: Self-pay | Admitting: Internal Medicine

## 2017-08-19 DIAGNOSIS — Z515 Encounter for palliative care: Secondary | ICD-10-CM

## 2017-08-19 DIAGNOSIS — J984 Other disorders of lung: Secondary | ICD-10-CM

## 2017-08-19 DIAGNOSIS — G9341 Metabolic encephalopathy: Secondary | ICD-10-CM

## 2017-08-19 DIAGNOSIS — R4182 Altered mental status, unspecified: Secondary | ICD-10-CM

## 2017-08-19 DIAGNOSIS — Z7189 Other specified counseling: Secondary | ICD-10-CM

## 2017-08-19 LAB — CBC WITH DIFFERENTIAL/PLATELET
Basophils Absolute: 0 10*3/uL (ref 0.0–0.1)
Basophils Relative: 0 %
EOS PCT: 0 %
Eosinophils Absolute: 0 10*3/uL (ref 0.0–0.7)
HEMATOCRIT: 41.6 % (ref 39.0–52.0)
Hemoglobin: 13.7 g/dL (ref 13.0–17.0)
LYMPHS PCT: 5 %
Lymphs Abs: 0.7 10*3/uL (ref 0.7–4.0)
MCH: 32.5 pg (ref 26.0–34.0)
MCHC: 32.9 g/dL (ref 30.0–36.0)
MCV: 98.6 fL (ref 78.0–100.0)
Monocytes Absolute: 0.8 10*3/uL (ref 0.1–1.0)
Monocytes Relative: 5 %
NEUTROS ABS: 14.2 10*3/uL — AB (ref 1.7–7.7)
NEUTROS PCT: 90 %
Platelets: 250 10*3/uL (ref 150–400)
RBC: 4.22 MIL/uL (ref 4.22–5.81)
RDW: 13.7 % (ref 11.5–15.5)
WBC: 15.7 10*3/uL — ABNORMAL HIGH (ref 4.0–10.5)

## 2017-08-19 LAB — BASIC METABOLIC PANEL
ANION GAP: 16 — AB (ref 5–15)
BUN: 29 mg/dL — ABNORMAL HIGH (ref 6–20)
CALCIUM: 10.5 mg/dL — AB (ref 8.9–10.3)
CO2: 19 mmol/L — AB (ref 22–32)
Chloride: 111 mmol/L (ref 101–111)
Creatinine, Ser: 0.88 mg/dL (ref 0.61–1.24)
GFR calc Af Amer: >60 mL/min (ref >60)
GFR calc non Af Amer: >60 mL/min (ref >60)
Glucose, Bld: 127 mg/dL — ABNORMAL HIGH (ref 65–99)
Potassium: 4.5 mmol/L (ref 3.5–5.1)
Sodium: 146 mmol/L — ABNORMAL HIGH (ref 135–145)

## 2017-08-19 MED ORDER — BACLOFEN 10 MG PO TABS
5.0000 mg | ORAL_TABLET | Freq: Three times a day (TID) | ORAL | Status: DC
  Administered 2017-08-19 – 2017-08-21 (×6): 5 mg via ORAL
  Filled 2017-08-19 (×6): qty 1

## 2017-08-19 MED ORDER — DEXAMETHASONE 4 MG PO TABS
4.0000 mg | ORAL_TABLET | Freq: Four times a day (QID) | ORAL | Status: DC
  Administered 2017-08-19 – 2017-08-20 (×4): 4 mg via ORAL
  Filled 2017-08-19 (×4): qty 1

## 2017-08-19 NOTE — Progress Notes (Signed)
Physical Therapy Treatment Patient Details Name: Kirk Carson MRN: [id number] DOB: 1950/12/18 Today's Date: 08/19/2017    History of Present Illness Pt was admitted for CAP.  H/O CVA with no residual deficits, HTN, smoker. Found to have lung mass with mets to brain    PT Comments    The patient is very somnolent, barely aroused until assisted to sit on the bed edge. The patient was not able to stand today with 2 attempts. Continue PT.  Follow Up Recommendations  SNF;Supervision/Assistance - 24 hour     Equipment Recommendations  None recommended by PT    Recommendations for Other Services       Precautions / Restrictions Precautions Precautions: Fall    Mobility  Bed Mobility Overal bed mobility: Needs Assistance Bed Mobility: Sidelying to Sit;Sit to Sidelying;Rolling Rolling: Mod assist Sidelying to sit: Mod assist     Sit to sidelying: Max assist General bed mobility comments: assist to roll and to place legs over bed edge and to sit up with patient pushing up onto elbow, . assisted to return to supime  and place legs on bed edge  Transfers Overall transfer level: Needs assistance Equipment used: Rolling walker (2 wheeled) Transfers: Sit to/from Stand Sit to Stand: Total assist;+2 physical assistance;+2 safety/equipment         General transfer comment: attempted to stand x 2 with patient not bearing weight   Ambulation/Gait                 Stairs             Wheelchair Mobility    Modified Rankin (Stroke Patients Only)       Balance Overall balance assessment: Needs assistance Sitting-balance support: Feet supported;Single extremity supported Sitting balance-Leahy Scale: Fair Sitting balance - Comments: patient did sit at midline , not listing.                                    Cognition Arousal/Alertness: Lethargic Behavior During Therapy: Flat affect Overall Cognitive Status: Impaired/Different from  baseline Area of Impairment: Following commands                               General Comments: patient barely arouses until sat at bed edge. no verbalizations, not following commmands, required hand over hand for all activity      Exercises      General Comments        Pertinent Vitals/Pain Pain Assessment: No/denies pain Faces Pain Scale: No hurt    Home Living                      Prior Function            PT Goals (current goals can now be found in the care plan section) Progress towards PT goals: Progressing toward goals    Frequency    Min 2X/week      PT Plan Current plan remains appropriate    Co-evaluation              AM-PAC PT "6 Clicks" Daily Activity  Outcome Measure  Difficulty turning over in bed (including adjusting bedclothes, sheets and blankets)?: Unable Difficulty moving from lying on back to sitting on the side of the bed? : Unable Difficulty sitting down on and standing up from a chair with arms (e.g.,  wheelchair, bedside commode, etc,.)?: Unable Help needed moving to and from a bed to chair (including a wheelchair)?: Total Help needed walking in hospital room?: Total Help needed climbing 3-5 steps with a railing? : Total 6 Click Score: 6    End of Session   Activity Tolerance: Patient tolerated treatment well Patient left: in bed;with bed alarm set;with family/visitor present Nurse Communication: Mobility status;Need for lift equipment PT Visit Diagnosis: Unsteadiness on feet (R26.81);Muscle weakness (generalized) (M62.81);Difficulty in walking, not elsewhere classified (R26.2)     Time: 9574-7340 PT Time Calculation (min) (ACUTE ONLY): 17 min  Charges:  $Therapeutic Exercise: 8-22 mins                    G CodesTresa Endo PT [phone]    [name] 08/19/2017, 2:03 PM

## 2017-08-19 NOTE — Consult Note (Signed)
Consultation Note Date: 08/19/2017   Patient Name: Kirk Carson  DOB: December 20, 1950  MRN: [id number]  Age / Sex: 67 y.o., male  PCP: Patient, No Pcp Per Referring Physician: Charlynne Cousins, MD  Reason for Consultation: Establishing goals of care  HPI/Patient Profile: 67 y.o. 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 Work up for suspected lung cancer with brain mets continued over hospitalization. Treating for CAP vs postobstructive pneumonia. Planning for biopsy and radiation oncology following for whole brain radiation and also started on Decadron and Keppra.   Clinical Assessment and Goals of Care: I met today at Kirk Carson bedside. He is present but does not participate in conversation. Wife and daughter, Tab, is at bedside. We had discussion regarding diagnosis and poor prognosis. This has come very suddenly for them and they are still processing this information. He was working as a Psychologist, counselling up until 2 months ago and his only complaint was that the work was hard on his knees. Tab says that she has noticed some periods of repeating questions (she questioned his hearing) and "unable to find words" along with falls at home since he has been out of work. He was in the process of filing citizenship papers? ith hopes of collecting social security as he they have been in the [place] since 1987 (all speak very good [place]) from [place], [place].   They are confused as far as discharge plans and options. I have asked CSW and CMRN to provide information for them. We did discuss their wishes for him given his poor prognosis and what is most important to Kirk Carson and to them as a family. They are considering taking him  home. They are both very tearful during out conversation as they are still very shocked to be having these conversations. They both have good understanding of his advanced and aggressive disease. We also addressed treatment options and they are hopeful to complete radiation therapy (discussed if they would be able to get him from home to appts as well). Also discussed we have to see how he tolerates, accepts, and responds to radiation therapy before any next step. Explained how functional status and expectations play a factor in the ability to provide any chemotherapy or immunotherapy as sometimes this may only cause harm. We did briefly discuss that after radiation therapy if no further treatments available/offered then we would be focusing on comfort and QOL.   We did also discuss code status and given the severity of his metastatic process this is important to consider his wishes and what he would want. I challenged them to consider any comments he had made in reference to other friends/family and their illness and medical decisions. Tab is tearful but says that she does not believe her father would desire life support. Wife is tearful and struggling to know what to say. I explained to  them that this is something to consider and discuss as a family as well as this is a very big decision.   Offered emotional support during very difficult conversation. I will continue to follow.   Primary Decision Maker NEXT OF KIN wife    SUMMARY OF RECOMMENDATIONS   - Considering DNR - Considering home vs SNF placement (unlikely to afford out of pocket)  Code Status/Advance Care Planning:  Full code - likely to change to DNR after continuing family discussions   Symptom Management:   Confusion: Decadron and brain radiation ongoing.   Agitation: Same as above. Appears improved today per family.   AMS: He is basically nonverbal and occasionally pulling at pulse ox or IV but easily redirected today. Likely  r/t brain mets.   Palliative Prophylaxis:   Aspiration, Bowel Regimen, Delirium Protocol, Frequent Pain Assessment and Turn Reposition  Additional Recommendations (Limitations, Scope, Preferences):  Full Scope Treatment  Psycho-social/Spiritual:   Desire for further Chaplaincy support:yes  Additional Recommendations: Caregiving  Support/Resources, Education on Hospice, Grief/Bereavement Support and [place] Referral  Prognosis:   Very poor with extensive and aggressive metastatic cancer. Likely months or less.   Discharge Planning: To Be Determined      Primary Diagnoses: Present on Admission: . CAP (community acquired pneumonia) . Brain metastases ([place]) . Hypertension . Tobacco use disorder   I have reviewed the medical record, interviewed the patient and family, and examined the patient. The following aspects are pertinent.  Past Medical History:  Diagnosis Date  . Embolic stroke ([place]) 8/84/16   multiple territories by MRI  . History of TIA (transient ischemic attack)   . Hyperlipidemia   . Hypertension   . Iron deficiency anemia   . Stroke [facility])    TIA 08/2013  . Tobacco abuse   . Unspecified constipation    Social History   Socioeconomic History  . Marital status: Married    Spouse name: Not on file  . Number of children: Not on file  . Years of education: Not on file  . Highest education level: Not on file  Occupational History  . Not on file  Social Needs  . Financial resource strain: Not on file  . Food insecurity:    Worry: Not on file    Inability: Not on file  . Transportation needs:    Medical: Not on file    Non-medical: Not on file  Tobacco Use  . Smoking status: Current Every Day Smoker    Packs/day: 1.00    Types: Cigarettes  . Smokeless tobacco: Never Used  Substance and Sexual Activity  . Alcohol use: Yes    Comment: occasionally  . Drug use: No  . Sexual activity: Not on file  Lifestyle  . Physical activity:    Days per  week: Not on file    Minutes per session: Not on file  . Stress: Not on file  Relationships  . Social connections:    Talks on phone: Not on file    Gets together: Not on file    Attends religious service: Not on file    Active member of club or organization: Not on file    Attends meetings of clubs or organizations: Not on file    Relationship status: Not on file  Other Topics Concern  . Not on file  Social History Narrative   Lives in [place].  Retired from working in [employer] as a Librarian, academic. Originally from [place].    Family History  Problem Relation Age of Onset  . Birth defects Mother   . Colon cancer Neg Hx    Scheduled Meds: . budesonide (PULMICORT) nebulizer solution  0.5 mg Nebulization BID  . dexamethasone  4 mg Intravenous Q6H  . guaiFENesin  10 mL Oral BID  . ipratropium-albuterol  3 mL Nebulization TID  . levETIRAcetam  500 mg Oral BID  . nicotine  21 mg Transdermal Daily  . pantoprazole  40 mg Oral Daily  . senna-docusate  1 tablet Oral QHS  . sodium chloride flush  3 mL Intravenous Q12H   Continuous Infusions: . ondansetron (ZOFRAN) IV     PRN Meds:.acetaminophen, alum & mag hydroxide-simeth, guaiFENesin-dextromethorphan, LORazepam, ondansetron **OR** ondansetron **OR** ondansetron (ZOFRAN) IV **OR** ondansetron (ZOFRAN) IV, zolpidem Allergies  Allergen Reactions  . Asa [Aspirin] Other (See Comments)    Occasionally causes heartburn (only with 325 mg tablets)   Review of Systems  Unable to perform ROS: Mental status change    Physical Exam  Constitutional: He appears well-developed. He appears lethargic.  HENT:  Head: Normocephalic and atraumatic.  Cardiovascular: Normal rate and regular rhythm.  Pulmonary/Chest: Effort normal. No accessory muscle usage. No tachypnea. No respiratory distress.  Abdominal: Normal appearance.  Neurological: He appears lethargic. He is disoriented.  Psychiatric: Cognition and memory are impaired. He  expresses impulsivity.  Nursing note and vitals reviewed.   Vital Signs: BP 106/79 (BP Location: Right Arm)   Pulse 91   Temp 97.6 F (36.4 C) (Oral)   Resp 15   Ht '5\' 4"'$  (1.626 m)   Wt 64.9 kg (143 lb 1.3 oz)   SpO2 100%   BMI 24.56 kg/m  Pain Scale: 0-10 POSS *See Group Information*: 1-Acceptable,Awake and alert Pain Score: 0-No pain   SpO2: SpO2: 100 % O2 Device:SpO2: 100 % O2 Flow Rate: .O2 Flow Rate (L/min): 2 L/min  IO: Intake/output summary:   Intake/Output Summary (Last 24 hours) at 08/19/2017 0833 Last data filed at 08/18/2017 1330 Gross per 24 hour  Intake 580 ml  Output -  Net 580 ml    LBM: Last BM Date: 08/17/17 Baseline Weight: Weight: 70 kg (154 lb 5.2 oz) Most recent weight: Weight: 64.9 kg (143 lb 1.3 oz)     Palliative Assessment/Data: 30%     Time Total: 75 min  Greater than 50%  of this time was spent counseling and coordinating care related to the above assessment and plan.  Signed by: Vinie Sill, NP Palliative Medicine Team Pager # [phone] (M-F 8a-5p) Team Phone # [phone] (Nights/Weekends)

## 2017-08-19 NOTE — Telephone Encounter (Signed)
Scheduled appt per 5/8 sch message - unable to leave message with appt date and time and sent message with appt date and time.

## 2017-08-19 NOTE — Progress Notes (Signed)
[facility] PROGRESS NOTE    Progress Note  Kirk Carson  [id number] DOB: 04-Jan-1951 DOA: 08/09/2017 PCP: Patient, No Pcp Per     Brief Narrative:   Kirk Carson is an 67 y.o. male past medical history of CVA, hypertension active smoker admitted for presumed confusion was found to have a lung mass with multiple's metastases to the brain and possible community-acquired pneumonia  Assessment/Plan:   CAP (community acquired pneumonia) versus postobstructive: He has been pan cultures and they have been negative. Inhalers he has completed his antibiotic regimen.   Cavitating mass in right upper lung lobe/metastatic to the brain IR has been consulted and he is status post ultrasound-guided biopsy with pathology that showed poorly differentiated carcinoma, patient is currently receiving radiation therapy for his brain metastases, he was started on IV Decadron and Keppra. Patient will therapy has been consulted who recommended skilled nursing facility. We will transition IV Decadron and Keppra to oral. PMT meeting to happen today.  Acute encephalopathy/dysphagia: Secondary to metastatic brain disease. As per wife his mentation is now improved.  Awaiting his diet.  Chronic diastolic heart failure: His wheezing did improve with IV Lasix he seems euvolemic at this point.  Essential hypertension: Stable changes made.  DVT prophylaxis: lovenox Family Communication:wife Disposition Plan/Barrier to D/C: SNF in am Code Status:     Code Status Orders  (From admission, onward)        Start     Ordered   08/10/17 0032  Full code  Continuous     08/10/17 0031    Code Status History    Date Active Date Inactive Code Status Order ID Comments User Context   08/23/2013 1028 08/25/2013 1630 Full Code [id number]  Kelvin Cellar, MD Inpatient        IV Access:    Peripheral IV   Procedures and diagnostic studies:   No results found.   Medical Consultants:     None.  Anti-Infectives:   None  Subjective:    Kirk Carson tolerating diet as per wife his mentation is improved.  Objective:    Vitals:   08/19/17 0518 08/19/17 0613 08/19/17 0754 08/19/17 0757  BP:  106/79    Pulse:  91    Resp:  15    Temp:  97.6 F (36.4 C)    TempSrc:  Oral    SpO2:  98% 98% 100%  Weight: 64.9 kg (143 lb 1.3 oz)     Height:        Intake/Output Summary (Last 24 hours) at 08/19/2017 0954 Last data filed at 08/18/2017 1330 Gross per 24 hour  Intake 480 ml  Output -  Net 480 ml   Filed Weights   08/17/17 0512 08/18/17 0438 08/19/17 0518  Weight: 68.5 kg (151 lb 0.2 oz) 64.6 kg (142 lb 6.7 oz) 64.9 kg (143 lb 1.3 oz)    Exam: General exam: In no acute distress. Respiratory system: Good air movement and clear to auscultation. Cardiovascular system: S1 & S2 heard, RRR.  Gastrointestinal system: Abdomen is nondistended, soft and nontender.  Central nervous system: Alert and oriented. No focal neurological deficits. Extremities: No pedal edema. Skin: No rashes, lesions or ulcers Psychiatry: Patient has no evidence of medical condition   Data Reviewed:    Labs: Basic Metabolic Panel: Recent Labs  Lab 08/15/17 0414 08/16/17 0401 08/17/17 0426 08/18/17 0707 08/19/17 0424  NA 136 140 142 141 146*  K 3.7 4.0 4.4 4.0 4.5  CL 102 105 107 107 111  CO2 20* 21* 20* 20* 19*  GLUCOSE 127* 104* 115* 103* 127*  BUN 23* 26* 30* 28* 29*  CREATININE 0.81 0.86 0.85 0.79 0.88  CALCIUM 9.7 9.8 10.0 10.2 10.5*   GFR Estimated Creatinine Clearance: 70.1 mL/min (by C-G formula based on SCr of 0.88 mg/dL). Liver Function Tests: No results for input(s): AST, ALT, ALKPHOS, BILITOT, PROT, ALBUMIN in the last 168 hours. No results for input(s): LIPASE, AMYLASE in the last 168 hours. No results for input(s): AMMONIA in the last 168 hours. Coagulation profile No results for input(s): INR, PROTIME in the last 168 hours.  CBC: Recent Labs  Lab  08/13/17 0404 08/14/17 0425 08/16/17 0401 08/17/17 0426 08/19/17 0424  WBC 9.9 11.7* 14.1* 14.1* 15.7*  NEUTROABS  --   --   --   --  14.2*  HGB 12.4* 12.6* 13.7 13.2 13.7  HCT 36.9* 37.4* 40.5 40.5 41.6  MCV 97.4 97.4 97.8 97.8 98.6  PLT 261 267 283 284 250   Cardiac Enzymes: No results for input(s): CKTOTAL, CKMB, CKMBINDEX, TROPONINI in the last 168 hours. BNP (last 3 results) No results for input(s): PROBNP in the last 8760 hours. CBG: Recent Labs  Lab 08/14/17 2139  GLUCAP 119*   D-Dimer: No results for input(s): DDIMER in the last 72 hours. Hgb A1c: No results for input(s): HGBA1C in the last 72 hours. Lipid Profile: No results for input(s): CHOL, HDL, LDLCALC, TRIG, CHOLHDL, LDLDIRECT in the last 72 hours. Thyroid function studies: No results for input(s): TSH, T4TOTAL, T3FREE, THYROIDAB in the last 72 hours.  Invalid input(s): FREET3 Anemia work up: No results for input(s): VITAMINB12, FOLATE, FERRITIN, TIBC, IRON, RETICCTPCT in the last 72 hours. Sepsis Labs: Recent Labs  Lab 08/13/17 0404 08/14/17 0425 08/16/17 0401 08/17/17 0426 08/19/17 0424  WBC 9.9 11.7* 14.1* 14.1* 15.7*  LATICACIDVEN 2.6* 2.8*  --   --   --    Microbiology Recent Results (from the past 240 hour(s))  Blood Culture (routine x 2)     Status: None   Collection Time: 08/09/17 10:20 PM  Result Value Ref Range Status   Specimen Description BLOOD RIGHT ANTECUBITAL  Final   Special Requests   Final    BOTTLES DRAWN AEROBIC AND ANAEROBIC Blood Culture results may not be optimal due to an inadequate volume of blood received in culture bottles   Culture   Final    NO GROWTH 5 DAYS Performed at [facility] [address] [place], [place] [address]    Report Status 08/14/2017 FINAL  Final     Medications:   . budesonide (PULMICORT) nebulizer solution  0.5 mg Nebulization BID  . dexamethasone  4 mg Intravenous Q6H  . guaiFENesin  10 mL Oral BID  . ipratropium-albuterol  3  mL Nebulization TID  . levETIRAcetam  500 mg Oral BID  . nicotine  21 mg Transdermal Daily  . pantoprazole  40 mg Oral Daily  . senna-docusate  1 tablet Oral QHS  . sodium chloride flush  3 mL Intravenous Q12H   Continuous Infusions: . ondansetron (ZOFRAN) IV        LOS: 9 days   [place] [facility] Pager [phone]  *Please refer to [email or web address], password [email or web address] to get updated schedule on who will round on this patient, as hospitalists switch teams weekly. If 7PM-7AM, please contact night-coverage at [email or web address], password [email or web address] for any overnight needs.  08/19/2017, 9:54 AM

## 2017-08-20 ENCOUNTER — Encounter: Payer: Self-pay | Admitting: Radiation Oncology

## 2017-08-20 ENCOUNTER — Ambulatory Visit
Admit: 2017-08-20 | Discharge: 2017-08-20 | Disposition: A | Discharge status: Home / Self Care | Payer: Self-pay | Attending: Radiation Oncology | Admitting: Radiation Oncology

## 2017-08-20 DIAGNOSIS — E870 Hyperosmolality and hypernatremia: Secondary | ICD-10-CM

## 2017-08-20 DIAGNOSIS — E43 Unspecified severe protein-calorie malnutrition: Secondary | ICD-10-CM

## 2017-08-20 MED ORDER — SODIUM BICARBONATE 8.4 % IV SOLN
INTRAVENOUS | Status: DC
  Filled 2017-08-20: qty 1000

## 2017-08-20 MED ORDER — DEXAMETHASONE SODIUM PHOSPHATE 10 MG/ML IJ SOLN
4.0000 mg | Freq: Four times a day (QID) | INTRAMUSCULAR | Status: DC
Start: 2017-08-20 — End: 2017-08-21
  Administered 2017-08-20 – 2017-08-21 (×4): 4 mg via INTRAVENOUS
  Filled 2017-08-20 (×4): qty 1

## 2017-08-20 MED ORDER — ZOLEDRONIC ACID 4 MG/5ML IV CONC
4.0000 mg | Freq: Once | INTRAVENOUS | Status: DC
  Filled 2017-08-20: qty 5

## 2017-08-20 MED ORDER — SODIUM CHLORIDE 0.45 % IV SOLN
INTRAVENOUS | Status: AC
  Administered 2017-08-20 (×2): via INTRAVENOUS
  Filled 2017-08-20 (×3): qty 1000

## 2017-08-20 MED ORDER — ENSURE ENLIVE PO LIQD
237.0000 mL | Freq: Two times a day (BID) | ORAL | Status: DC
  Administered 2017-08-20 – 2017-08-21 (×2): 237 mL via ORAL

## 2017-08-20 MED ORDER — IPRATROPIUM-ALBUTEROL 0.5-2.5 (3) MG/3ML IN SOLN
3.0000 mL | Freq: Two times a day (BID) | RESPIRATORY_TRACT | Status: DC
  Administered 2017-08-20 – 2017-08-21 (×3): 3 mL via RESPIRATORY_TRACT
  Filled 2017-08-20 (×3): qty 3

## 2017-08-20 NOTE — Consult Note (Signed)
[facility] [place]:  Referral received from [place] to the pt's wife and she was not at hospital and can not meet today. She will meet [place] at 1000am tomorrow morning to discuss hospice services at home. Webb Silversmith RN [phone]

## 2017-08-20 NOTE — Progress Notes (Signed)
After reviewing his case again with palliative care we are in support of discontinuing therapy and proceeding with hospice at home.     Carola Rhine, PAC

## 2017-08-20 NOTE — Care Management Note (Signed)
Case Management Note  Patient Details  Name: Kirk Carson MRN: [id number] Date of Birth: 07-30-1950  CM consult for home hospice referral. This CM spoke with pt and wife at bedside about home hospice services. Wife requests hospital bed for home. When choice offered, [facility] was chosen. [facility] referral line called to give referral.   Lynnell Catalan, RN 08/20/2017, 2:34 PM  [phone]

## 2017-08-20 NOTE — Progress Notes (Signed)
[facility] PROGRESS NOTE    Progress Note  Kirk Carson  [id number] DOB: 1951/01/16 DOA: 08/09/2017 PCP: Patient, No Pcp Per     Brief Narrative:   Kirk Carson is an 67 y.o. male past medical history of CVA, hypertension active smoker admitted for presumed confusion was found to have a lung mass with multiple's metastases to the brain and possible community-acquired pneumonia  Assessment/Plan:   CAP (community acquired pneumonia) versus postobstructive: He has been pan cultures and they have been negative. Inhalers he has completed his antibiotic regimen.  Cavitating mass in right upper lung lobe/metastatic to the brain IR has been consulted and he is status post ultrasound-guided biopsy with pathology that showed poorly differentiated carcinoma, patient is currently receiving radiation therapy for his brain metastases, he was started on IV Decadron and Keppra. Patient is more confused today we will start him back on IV Decadron. Hospice and comfort care met with family made him a DNR.  Acute encephalopathy/dysphagia: Secondary to metastatic brain disease. Tolerating his diet.  Chronic diastolic heart failure: Discontinue IV Lasix.  Essential hypertension: Stable changes made.  New Hypernatremia: Start 0.45 with bicarbonate per night supplements check a basic metabolic panel in the morning early to decrease oral intake.  New Hypercalcemia: We will start him on aggressive IV fluid hydration. Likely due to malignancy. Recheck a basic metabolic panel in the morning after hydration.  Severe protein caloric malnutrition: Ensure 3 times daily.  DVT prophylaxis: lovenox Family Communication:wife Disposition Plan/Barrier to D/C: Home in the morning. Code Status:     Code Status Orders  (From admission, onward)        Start     Ordered   08/10/17 0032  Full code  Continuous     08/10/17 0031    Code Status History    Date Active Date Inactive Code  Status Order ID Comments User Context   08/23/2013 1028 08/25/2013 1630 Full Code [id number]  Kelvin Cellar, MD Inpatient        IV Access:    Peripheral IV   Procedures and diagnostic studies:   No results found.   Medical Consultants:    None.  Anti-Infectives:   None  Subjective:    Kirk Carson wife relate his mentation is worsened.  Objective:    Vitals:   08/19/17 2214 08/19/17 2312 08/20/17 0603 08/20/17 0811  BP: 105/83  106/74   Pulse: 84  86   Resp: 12  12   Temp: 98.4 F (36.9 C)  97.7 F (36.5 C)   TempSrc: Oral  Oral   SpO2: 95% 97% 95% 96%  Weight:      Height:        Intake/Output Summary (Last 24 hours) at 08/20/2017 0949 Last data filed at 08/19/2017 1534 Gross per 24 hour  Intake 480 ml  Output -  Net 480 ml   Filed Weights   08/17/17 0512 08/18/17 0438 08/19/17 0518  Weight: 68.5 kg (151 lb 0.2 oz) 64.6 kg (142 lb 6.7 oz) 64.9 kg (143 lb 1.3 oz)    Exam: General exam: In no acute distress, cachectic Respiratory system: Good air movement and clear to auscultation. Cardiovascular system: S1 & S2 heard, RRR.  Gastrointestinal system: Abdomen is nondistended, soft and nontender.  Central nervous system: Alert and oriented. No focal neurological deficits. Extremities: No pedal edema. Skin: No rashes, lesions or ulcers Psychiatry: Patient has no evidence of medical condition   Data Reviewed:    Labs: Basic Metabolic Panel: Recent  Labs  Lab 08/15/17 0414 08/16/17 0401 08/17/17 0426 08/18/17 0707 08/19/17 0424  NA 136 140 142 141 146*  K 3.7 4.0 4.4 4.0 4.5  CL 102 105 107 107 111  CO2 20* 21* 20* 20* 19*  GLUCOSE 127* 104* 115* 103* 127*  BUN 23* 26* 30* 28* 29*  CREATININE 0.81 0.86 0.85 0.79 0.88  CALCIUM 9.7 9.8 10.0 10.2 10.5*   GFR Estimated Creatinine Clearance: 70.1 mL/min (by C-G formula based on SCr of 0.88 mg/dL). Liver Function Tests: No results for input(s): AST, ALT, ALKPHOS, BILITOT, PROT, ALBUMIN  in the last 168 hours. No results for input(s): LIPASE, AMYLASE in the last 168 hours. No results for input(s): AMMONIA in the last 168 hours. Coagulation profile No results for input(s): INR, PROTIME in the last 168 hours.  CBC: Recent Labs  Lab 08/14/17 0425 08/16/17 0401 08/17/17 0426 08/19/17 0424  WBC 11.7* 14.1* 14.1* 15.7*  NEUTROABS  --   --   --  14.2*  HGB 12.6* 13.7 13.2 13.7  HCT 37.4* 40.5 40.5 41.6  MCV 97.4 97.8 97.8 98.6  PLT 267 283 284 250   Cardiac Enzymes: No results for input(s): CKTOTAL, CKMB, CKMBINDEX, TROPONINI in the last 168 hours. BNP (last 3 results) No results for input(s): PROBNP in the last 8760 hours. CBG: Recent Labs  Lab 08/14/17 2139  GLUCAP 119*   D-Dimer: No results for input(s): DDIMER in the last 72 hours. Hgb A1c: No results for input(s): HGBA1C in the last 72 hours. Lipid Profile: No results for input(s): CHOL, HDL, LDLCALC, TRIG, CHOLHDL, LDLDIRECT in the last 72 hours. Thyroid function studies: No results for input(s): TSH, T4TOTAL, T3FREE, THYROIDAB in the last 72 hours.  Invalid input(s): FREET3 Anemia work up: No results for input(s): VITAMINB12, FOLATE, FERRITIN, TIBC, IRON, RETICCTPCT in the last 72 hours. Sepsis Labs: Recent Labs  Lab 08/14/17 0425 08/16/17 0401 08/17/17 0426 08/19/17 0424  WBC 11.7* 14.1* 14.1* 15.7*  LATICACIDVEN 2.8*  --   --   --    Microbiology No results found for this or any previous visit (from the past 240 hour(s)).   Medications:   . baclofen  5 mg Oral TID  . budesonide (PULMICORT) nebulizer solution  0.5 mg Nebulization BID  . dexamethasone  4 mg Intravenous Q6H  . guaiFENesin  10 mL Oral BID  . ipratropium-albuterol  3 mL Nebulization BID  . levETIRAcetam  500 mg Oral BID  . nicotine  21 mg Transdermal Daily  . pantoprazole  40 mg Oral Daily  . senna-docusate  1 tablet Oral QHS  . sodium chloride flush  3 mL Intravenous Q12H   Continuous Infusions: . dextrose 5 % 1,000  mL with sodium bicarbonate 150 mEq infusion    . ondansetron (ZOFRAN) IV        LOS: 10 days   Charlynne Cousins  [facility] Pager [phone]  *Please refer to [email or web address], password [email or web address] to get updated schedule on who will round on this patient, as hospitalists switch teams weekly. If 7PM-7AM, please contact night-coverage at [email or web address], password [email or web address] for any overnight needs.  08/20/2017, 9:49 AM

## 2017-08-20 NOTE — Progress Notes (Signed)
Met with pt and wife at bedside along with cm to discuss disposition plan.  Pt was awake but did not interact- fairly consistent with his presentation throughout course here. Pt's wife explains she has discussed pt's medical status and prognosis with providers and understands that pt's prognosis is poor. She states she, her daughter, and son are hoping to have pt return home at DC and focus on comfort and symptom management. Are ready to enlist hospice at home . Wife has questions as to wether continuing pt's radiation treatments is beneficial- reports if so she will plan to transport him from home for radiation but wants to ensure the potential benefit outweighs the burden on pt. PMT planning to meet with pt and wife again today. CM following as well for referring pt to hospice services at home. Will follow for needs- plan: return home at DC with hospice.   Sharren Bridge, MSW, LCSW Clinical Social Work 08/20/2017 [phone]

## 2017-08-20 NOTE — Progress Notes (Signed)
Palliative:  Mr. Kirk Carson is lying in bed and continues to be confused, not really any changes in mental state. Family not at bedside. I called and spoke with daughter, Kirk Carson, who sounds very tearful over the phone. She says that her mother came home to help clear space for hospital bed and they are preparing for him to come home tomorrow with hospice care. They are questioning the benefit vs consequences of continuing radiation. With the goal to take him home and have hospice I believe it will be very difficult for them to get him to radiation therapy and I am unsure of the benefit to him as far as QOL and prognosis. I will discuss with radiation oncology.   We also discussed resuscitation as they were leaning towards DNR yesterday. Kirk Carson is tearful and says that his sister felt strongly "that everything should be done." Kirk Carson says that herself, brother, and mother did not feel this is what is best for him and would desire DNR BUT they have given in to his other family at this time. Kirk Carson was too upset for me to discuss further at this time. I am hopeful that hospice can help with this discussion and help the other family recognize that resuscitation would not be beneficial to him in these circumstances.   20 min  Vinie Sill, NP Palliative Medicine Team Pager # [phone] (M-F 8a-5p) Team Phone # [phone] (Nights/Weekends)

## 2017-08-20 NOTE — Evaluation (Signed)
SLP Cancellation Note  Patient Details Name: Rice Walsh MRN: [id number] DOB: Apr 02, 1951   Cancelled treatment:       Reason Eval/Treat Not Completed: Other (comment)(from review of chart, note plans for possible hospice, will sign off at this time)   Macario Golds 08/20/2017, 3:43 PM   Luanna Salk, [facility] SLP [phone]

## 2017-08-20 NOTE — Progress Notes (Signed)
Nutrition Follow-up  INTERVENTION:   Continue Ensure Enlive po BID, each supplement provides 350 kcal and 20 grams of protein  NUTRITION DIAGNOSIS:   Increased nutrient needs related to cancer and cancer related treatments, chronic illness as evidenced by estimated needs.  Ongoing.  GOAL:   Patient will meet greater than or equal to 90% of their needs  Progressing.  MONITOR:   PO intake, Labs, Weight trends, Supplement acceptance  ASSESSMENT:   Pt. 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  Patient continues to be lethargic. However, PO intakes have ranged between 50-100%. Palliative care following, pt now DNR per MD note. Per SLP note from 5/4, pt with mild-moderate dysphagia but was recommended regular diet with thin liquids.  Ensure supplements were ordered, will monitor acceptance.  Medications: Senokot-S tablet daily Labs reviewed: Low Na  Diet Order:   Diet Order           Diet regular Room service appropriate? Yes; Fluid consistency: Thin  Diet effective now          EDUCATION NEEDS:   Not appropriate for education at this time  Skin:  Skin Assessment: Reviewed RN Assessment  Last BM:  4/29  Height:   Ht Readings from Last 1 Encounters:  08/12/17 5\' 4"  (1.626 m)    Weight:   Wt Readings from Last 1 Encounters:  08/19/17 143 lb 1.3 oz (64.9 kg)    Ideal Body Weight:  67.7 kg  BMI:  Body mass index is 24.56 kg/m.  Estimated Nutritional Needs:   Kcal:  2000-2200  Protein:  95-105g  Fluid:  2L/day  Kirk Bibles, MS, RD, LDN [place] Dietitian Pager: [phone] After Hours Pager: [phone]

## 2017-08-21 ENCOUNTER — Ambulatory Visit: Admit: 2017-08-21 | Payer: Self-pay

## 2017-08-21 DIAGNOSIS — G9341 Metabolic encephalopathy: Secondary | ICD-10-CM

## 2017-08-21 DIAGNOSIS — E43 Unspecified severe protein-calorie malnutrition: Secondary | ICD-10-CM

## 2017-08-21 LAB — BASIC METABOLIC PANEL
Anion gap: 18 — ABNORMAL HIGH (ref 5–15)
BUN: 42 mg/dL — AB (ref 6–20)
CO2: 20 mmol/L — ABNORMAL LOW (ref 22–32)
Calcium: 10.4 mg/dL — ABNORMAL HIGH (ref 8.9–10.3)
Chloride: 101 mmol/L (ref 101–111)
Creatinine, Ser: 0.91 mg/dL (ref 0.61–1.24)
Glucose, Bld: 163 mg/dL — ABNORMAL HIGH (ref 65–99)
POTASSIUM: 3.8 mmol/L (ref 3.5–5.1)
SODIUM: 139 mmol/L (ref 135–145)

## 2017-08-21 MED ORDER — ZOLPIDEM TARTRATE 5 MG PO TABS: 5.0000 mg | ORAL_TABLET | Freq: Every evening | ORAL | 0 refills | Status: AC | PRN

## 2017-08-21 MED ORDER — LEVETIRACETAM 100 MG/ML PO SOLN
500.0000 mg | Freq: Two times a day (BID) | ORAL | 12 refills | Status: AC
Start: 2017-08-21 — End: ?

## 2017-08-21 MED ORDER — BACLOFEN 5 MG PO TABS: 5.0000 mg | ORAL_TABLET | Freq: Three times a day (TID) | ORAL | 0 refills | Status: AC

## 2017-08-21 MED ORDER — DEXAMETHASONE 0.5 MG PO TABS: 4.0000 mg | ORAL_TABLET | Freq: Four times a day (QID) | ORAL | 0 refills | Status: AC

## 2017-08-21 MED ORDER — ENSURE ENLIVE PO LIQD: 237.0000 mL | Freq: Two times a day (BID) | ORAL | 12 refills | Status: AC

## 2017-08-21 NOTE — Progress Notes (Signed)
Physical Therapy Discharge Patient Details Name: Kirk Carson MRN: [id number] DOB: Jul 13, 1950 Today's Date: 08/21/2017 Time:  -     Patient discharged from PT services secondary to medical decline -note plans for Home with Hospice. Patient requires 2 assist for mobility, did not FC during last PT visit. Please see latest therapy progress note for current level of functioning and progress toward goals.    Gross PT [phone] GP     [name] 08/21/2017, 8:04 AM

## 2017-08-21 NOTE — Progress Notes (Signed)
Medical Necessity form filled out and printed for [facility] transport home. Staff RN to call [facility] for transport when pt ready for discharge. Marney Doctor RN,BSN,NCM [phone]

## 2017-08-21 NOTE — Consult Note (Signed)
[facility]:  Met in room with the pt's wife. She is in agreement tot he hospice philosophy and taking him home with hospice care. She will need a hospital bed, oxygen, OBT and wheelchair at home for pt. This will be ordered and delivered this evening. The pt will go home by ambulance. Alinda Sierras is aware. MD has already discharged pt home. Hospice services will begin when at home tentative plan is 200pm for nurse to be at home.   I will take scripts with me due to pt. Having no insurance so that Hospice can get them covered for them. Webb Silversmith RN [phone]

## 2017-08-21 NOTE — Discharge Summary (Signed)
Physician Discharge Summary  Kirk Carson [id number] DOB: 18-Dec-1950 DOA: 08/09/2017  PCP: Patient, No Pcp Per  Admit date: 08/09/2017 Discharge date: 08/21/2017  Admitted From: home Disposition:  Home  Recommendations for Outpatient Follow-up:  1. Follow up with Oncology in 1-2 weeks 2. Hospice and family care to follow-up at home for goals of care.  Home Health:Yes Equipment/Devices:none  Discharge Condition: [place] CODE STATUS:DNR Diet recommendation: Regular   Brief/Interim Summary: 67 y.o. male past medical history of CVA, hypertension active smoker admitted for presumed confusion was found to have a lung mass with multiple's metastases to the brain and possible community-acquired pneumonia    Discharge Diagnoses:  Principal Problem:   CAP (community acquired pneumonia) Active Problems:   Hypertension   Tobacco use disorder   Brain metastases ([place])   Cavitating mass in right upper lung lobe   Pneumonia of right upper lobe due to infectious organism ([place])   Acute metabolic encephalopathy   Goals of care, counseling/discussion   Palliative care encounter   Protein-calorie malnutrition, severe ([place])   Hypernatremia   Hypercalcemia  Community-acquired pneumonia versus postobstructive pneumonia: He was pancultured they remain negative he completed his course in-house. Oncology and radiation oncology were consulted and he was started radiation.  Cavitary right mass right upper lung mass with metastatic lesions to the brain: IR was consulted to perform ultrasound guided biopsy that showed poorly differentiated carcinoma he has been getting radiation. He was also started on IV Decadron and Keppra and his mentation improved. He met with hospice and palliative care in house and the family decided to make him DNR/DNI. Physical therapy evaluated the patient and recommended skilled nursing facility but due to financial reasons the family cannot afford it. Patient has a  very poor prognosis this was discussed with the family, they will follow-up with oncology as an outpatient.  Acute encephalopathy:  likely due to his metastatic lesions, after started on steroids is improved he will go home on Decadron orally.  Chronic diastolic heart failure: Seems to be euvolemic he was just giving small amounts of Lasix with improvement. He will not go home on oral Lasix.  Essential hypertension: No changes made.  Hypernatremia mild: He was started on half-normal saline and his sodium improved.  Hypercalcemia: Likely due to malignancy was resolved with IV fluid hydration.  Severe protein caloric malnutrition continue Ensure 3 times daily.  Discharge Instructions  Discharge Instructions    Diet - low sodium heart healthy   Complete by:  As directed    Increase activity slowly   Complete by:  As directed      Allergies as of 08/21/2017      Reactions   Asa [aspirin] Other (See Comments)   Occasionally causes heartburn (only with 325 mg tablets)      Medication List    STOP taking these medications   acetaminophen 325 MG tablet Commonly known as:  TYLENOL     TAKE these medications   Baclofen 5 MG Tabs Take 5 mg by mouth 3 (three) times daily.   dexamethasone 0.5 MG tablet Commonly known as:  DECADRON Take 8 tablets (4 mg total) by mouth every 6 (six) hours.   feeding supplement (ENSURE ENLIVE) Liqd Take 237 mLs by mouth 2 (two) times daily between meals.   levETIRAcetam 100 MG/ML solution Commonly known as:  KEPPRA Take 5 mLs (500 mg total) by mouth 2 (two) times daily.   zolpidem 5 MG tablet Commonly known as:  AMBIEN Take 1 tablet (5  mg total) by mouth at bedtime as needed for sleep.      Follow-up Information    [facility] Follow up.   Why:  for home hospice services Contact information: [place] [address] [phone]          Allergies  Allergen Reactions  . Asa [Aspirin] Other (See  Comments)    Occasionally causes heartburn (only with 325 mg tablets)    Consultations:  Hospice and palliative care.  Interventional radiology  Critical care   Procedures/Studies: Dg Chest 2 View  Result Date: 08/09/2017 CLINICAL DATA:  Cough, wheezing, and shortness of breath. EXAM: CHEST - 2 VIEW COMPARISON:  Chest x-ray dated Aug 23, 2013. FINDINGS: The heart size and mediastinal contours are within normal limits. Normal pulmonary vascularity. Consolidation within the right upper lobe. Coarsened interstitial markings in the peripheral mid to lower lungs is slightly progressed when compared to prior study. No pleural effusion or pneumothorax. No acute osseous abnormality. IMPRESSION: 1. Right upper lobe pneumonia. Followup PA and lateral chest X-ray is recommended in 3-4 weeks following trial of antibiotic therapy to ensure resolution and exclude underlying malignancy. 2. Chronic interstitial lung disease, slightly progressed. Electronically Signed   By: Titus Dubin M.D.   On: 08/09/2017 19:55   Ct Head Wo Contrast  Result Date: 08/09/2017 CLINICAL DATA:  Initial evaluation for acute ataxia, stroke suspected. EXAM: CT HEAD WITHOUT CONTRAST TECHNIQUE: Contiguous axial images were obtained from the base of the skull through the vertex without intravenous contrast. COMPARISON:  Prior MRI from 08/23/2013. FINDINGS: Brain: Study mildly degraded by motion artifact. Multiple hyperdense mass lesions are seen scattered throughout the bilateral cerebral hemispheres, consistent with metastatic disease. For reference purposes, largest lesion on the left seen air the left frontoparietal vertex and measures 13 mm (series 3, image 26). Largest lesion on the right position within the right parieto-occipital region and measures approximately 2.5 cm (series 5, image 54). Largest lesion on the left position within the left temporoparietal region and measures approximately 2.4 cm. 6 mm lesion noted at the  dorsal left midbrain (series 3, image 12). 10 mm left cerebellar lesion (series 3, image 6). Lesions are hyperdense in appearance, and may be partially hemorrhagic in nature. Localized vasogenic edema about several of the lesions without midline shift or significant mass effect. Underlying cerebral atrophy with mild chronic small vessel ischemic disease. No acute large vessel territory infarct. No definite extra-axial collection on this motion degraded exam. Ventricles normal in size without hydrocephalus. Crowding at the basilar cisterns without frank tentorial herniation. Vascular: No appreciable hyperdense vessel. Skull: Scalp soft tissues within normal limits. Calvarium intact. No focal osseous lesions. Sinuses/Orbits: Globes and orbital soft tissues within normal limits. Paranasal sinuses are clear. Changes related chronic maxillary sinusitis noted. No mastoid effusion. Other: None. IMPRESSION: Innumerable hyperdense lesions scattered throughout the supratentorial and infratentorial brain, consistent with metastatic disease. Localized vasogenic edema about several of the lesions without significant mass effect or midline shift. Several of these lesions are hyperdense in appearance, and may be partially hemorrhagic. Further evaluation with dedicated brain MRI and metastatic workup recommended. Electronically Signed   By: Jeannine Boga M.D.   On: 08/09/2017 22:21   Ct Angio Chest Pe W/cm &/or Wo Cm  Result Date: 08/10/2017 CLINICAL DATA:  Dyspnea EXAM: CT ANGIOGRAPHY CHEST WITH CONTRAST TECHNIQUE: Multidetector CT imaging of the chest was performed using the standard protocol during bolus administration of intravenous contrast. Multiplanar CT image reconstructions and MIPs  were obtained to evaluate the vascular anatomy. CONTRAST:  74 cc ISOVUE-370 IOPAMIDOL (ISOVUE-370) INJECTION 76% COMPARISON:  CXR 08/09/2017 FINDINGS: Cardiovascular: Heart is within normal limits for size. No pericardial effusion.  Minimal left main and three-vessel coronary arteriosclerosis. No acute pulmonary embolus. The pulmonary vessels to the right upper lobe are attenuated in appearance secondary to large right paramediastinal and suprahilar masslike abnormality likely representing an enlarged lymph node mass, estimated at 5.3 x 7.3 x 7.8 cm. No aortic aneurysm or dissection. Mediastinum/Nodes: Mediastinal adenopathy measuring 1.7 cm short axis in the subcarinal station, 2.3 cm in right lower paratracheal station, 2.1 cm right upper paratracheal station with bilateral hilar lymphadenopathy measuring up to 1.5 cm on the right and 1.2 cm short axis on the left. Confluent soft tissue masslike abnormality abutting the right superior mediastinum extending to the suprahilar portion of the mediastinum as above described likely represents a confluent lymph node mass attenuating the upper lobe pulmonary vessels and distal right mainstem bronchus. The thyroid gland is unremarkable. Midline trachea is identified. Luminal narrowing of the distal right mainstem bronchus secondary to a right paramediastinal/perihilar soft tissue mass is identified. Lungs/Pleura: Centrilobular and paraseptal emphysema with large confluent masslike abnormality centered within the right upper lobe measuring at least 8.9 x 7.7 x 5.5 cm. Small right effusion. Subpleural areas of interstitial prominence and fibrosis. No definite mass identified of the left lung. Upper Abdomen: Innumerable subcutaneous metastatic nodules scattered throughout the visualized anterior, right lateral and posterior chest wall. The largest is approximately 2.3 cm on series 5/31 in the right anterolateral chest wall. Musculoskeletal: Degenerative disc disease of the upper and midthoracic spine without aggressive osseous lesion noted. Review of the MIP images confirms the above findings. IMPRESSION: 1. Extensive centrilobular and paraseptal emphysema throughout both lungs. 2. An 8.9 x 7.7 x 5.5 cm  pulmonary mass centered within the right upper lobe is identified with confluent right paramediastinal, mediastinal and bilateral hilar lymphadenopathy with scattered areas of postobstructive pulmonary consolidation as well. Small right effusion is noted. Suspect findings are related to primary bronchogenic carcinoma with metastatic lymphadenopathy in the setting of COPD. Lymphoma may be within the differential though believed less likely. 3. Scattered metastatic soft tissue nodules noted of the chest wall, the largest is anterolateral measuring 2.3 cm. 4. Coronary arteriosclerosis and aortic atherosclerosis. 5. No acute pulmonary embolus. Aortic Atherosclerosis (ICD10-I70.0) and Emphysema (ICD10-J43.9). Electronically Signed   By: Ashley Royalty M.D.   On: 08/10/2017 00:24   Mr Brain Wo Contrast  Result Date: 08/10/2017 CLINICAL DATA:  Initial evaluation for acute altered mental status, metastatic disease. EXAM: MRI HEAD WITHOUT CONTRAST TECHNIQUE: Multiplanar, multiecho pulse sequences of the brain and surrounding structures were obtained without intravenous contrast. COMPARISON:  Prior CT from earlier the same day. FINDINGS: Brain: Examination technically limited due to patient's inability to tolerate the full length of the exam. Additionally, images provided are degraded by motion artifact. Again seen are innumerable metastatic lesions scattered throughout the bilateral cerebral and cerebellar hemispheres, more discernible and evident as compared to previous CT. Supratentorially, these lesions are primarily centered along the gray-white matter differentiation. There is extensive involvement throughout the cerebellum, with innumerable bilateral cerebellar metastases susceptibility artifacts seen associated with the preponderance of these lesions, consistent with hemorrhagic metastases. Few scattered superimposed fluid-fluid levels noted with these lesions, consistent with layering hematocrit (series 7, image 16  at the right parietal lobe for example). For reference purposes, largest discrete lesion in the right cerebral hemisphere positioned at the posterior right  parietal lobe and measures 3.4 x 2.0 cm (series 7, image 17). Probable invasion into the adjacent superior sagittal sinus noted (series 7, image 19). Largest discrete lesion within the left cerebellar hemisphere positioned at the high anterior left frontal lobe and measures approximately 1.3 x 2.2 cm. Within the cerebellum, largest discrete lesion measures approximately 2.2 cm on the right (series 7, image 6), although exact measurements are difficult given the extensive tumor burden and lack of IV contrast. Prominent lesion at the right cerebellar peduncle noted as well. Probable metastasis at the optic chiasm (series 5, image 12). There is extensive vasogenic edema throughout the cerebellar hemisphere related to metastatic disease. Fourth ventricle is narrowed and attenuated but remains patent at this time. Crowding at the craniocervical junction with descent of the cerebellar tonsils of up to 6 mm through the foramen magnum. Basilar cisterns remain patent superiorly. No hydrocephalus or midline shift. No evidence for acute infarct.  No extra-axial fluid collection. Vascular: Major arterial vascular flow voids are maintained at the skull base. Tumor invasion of the posterior aspect of the superior sagittal sinus (series 7, image 19). Skull and upper cervical spine: Vasogenic edema within the cerebellum with associated crowding at the craniocervical junction. Upper cervical spine within normal limits. Bone marrow signal intensity within normal limits. No obvious discrete lesion identified on this limited motion degraded exam. No scalp soft tissue abnormality. Sinuses/Orbits: Globes and orbital soft tissues within normal limits. Paranasal sinuses are clear. No mastoid effusion. Inner ear structures grossly normal. Other: None. IMPRESSION: 1. Limited study due to  patient's inability to tolerate the full length of the exam and motion artifact. 2. 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 Localized vasogenic edema about numerous lesions, with diffuse edema throughout the cerebellum and posterior fossa. Mass effect on the fourth ventricle which is narrowed and attenuated, with crowding at the foramen magnum. No hydrocephalus or frank herniation at this time. No midline shift. 3. Tumor invasion of the posterior aspect of the superior sagittal sinus as above. Electronically Signed   By: Jeannine Boga M.D.   On: 08/10/2017 02:40   Dg Swallowing Func-speech Pathology  Result Date: 08/15/2017 Objective Swallowing Evaluation: Type of Study: MBS-Modified Barium Swallow Study  Patient Details Name: Kirk Carson MRN: [id number] Date of Birth: 03/05/1951 Today's Date: 08/15/2017 Time: SLP Start Time (ACUTE ONLY): 57 -SLP Stop Time (ACUTE ONLY): 1140 SLP Time Calculation (min) (ACUTE ONLY): 10 min Past Medical History: Past Medical History: Diagnosis Date . Embolic stroke ([place]) 0/34/74  multiple territories by MRI . History of TIA (transient ischemic attack)  . Hyperlipidemia  . Hypertension  . Iron deficiency anemia  . Stroke [facility])   TIA 08/2013 . Tobacco abuse  . Unspecified constipation  Past Surgical History: Past Surgical History: Procedure Laterality Date . TEE WITHOUT CARDIOVERSION N/A 08/24/2013  Procedure: TRANSESOPHAGEAL ECHOCARDIOGRAM (TEE);  Surgeon: Josue Hector, MD;  Location: [facility] ENDOSCOPY;  Service: Cardiovascular;  Laterality: N/A; HPI: 67 yo male adm to [facility] with severe sepsis.  Pt PMH + for lung cancer, smoker, embolic cva [phone].  Pt found to have right upper pna suspected - Diffuse metastatic disease noted.  He has had increased falls/weakness recently and was admitted with increased SOB, cough.  Subjective: alert, cooperative, sitting up in chair in radiology  suite Assessment / Plan / Recommendation CHL IP CLINICAL IMPRESSIONS 08/15/2017 Clinical Impression Patient presents with a mild oropharyngeal dysphagia with likely impact from lethargy and current  cognitive impairment. Patient exhibited swallow initiation delays to vallecular sinus with all tested boluses and two instances of swallow initiation delay to pyriform sinus with thin liquids. No residuals post swallow and no penetration or aspiration observed. Patient would periodically cough with a wet/congested sound but this occured in the absence of any observable boluses in oral or pharyngeal cavities. Based on report from spouse of patient "hiccuping" and near-vomitting after eating, suspect patient has an esophageal dysphagia.  SLP Visit Diagnosis Dysphagia, oropharyngeal phase (R13.12) Attention and concentration deficit following -- Frontal lobe and executive function deficit following -- Impact on safety and function Mild aspiration risk   CHL IP TREATMENT RECOMMENDATION 08/15/2017 Treatment Recommendations Therapy as outlined in treatment plan below   Prognosis 08/15/2017 Prognosis for Safe Diet Advancement Good Barriers to Reach Goals Cognitive deficits Barriers/Prognosis Comment -- CHL IP DIET RECOMMENDATION 08/15/2017 SLP Diet Recommendations Regular solids;Thin liquid Liquid Administration via Cup;Straw Medication Administration Whole meds with puree Compensations Minimize environmental distractions;Slow rate;Small sips/bites Postural Changes Remain semi-upright after after feeds/meals (Comment);Seated upright at 90 degrees   CHL IP OTHER RECOMMENDATIONS 08/15/2017 Recommended Consults Consider GI evaluation;Consider esophageal assessment Oral Care Recommendations Oral care BID Other Recommendations --   CHL IP FOLLOW UP RECOMMENDATIONS 08/15/2017 Follow up Recommendations Other (comment)   CHL IP FREQUENCY AND DURATION 08/15/2017 Speech Therapy Frequency (ACUTE ONLY) min 2x/week Treatment Duration 1 week      CHL IP  ORAL PHASE 08/15/2017 Oral Phase Impaired Oral - Pudding Teaspoon -- Oral - Pudding Cup -- Oral - Honey Teaspoon -- Oral - Honey Cup -- Oral - Nectar Teaspoon -- Oral - Nectar Cup -- Oral - Nectar Straw -- Oral - Thin Teaspoon -- Oral - Thin Cup -- Oral - Thin Straw -- Oral - Puree Delayed oral transit Oral - Mech Soft -- Oral - Regular Delayed oral transit;Impaired mastication Oral - Multi-Consistency -- Oral - Pill Other (Comment) Oral Phase - Comment --  CHL IP PHARYNGEAL PHASE 08/15/2017 Pharyngeal Phase Impaired Pharyngeal- Pudding Teaspoon -- Pharyngeal -- Pharyngeal- Pudding Cup -- Pharyngeal -- Pharyngeal- Honey Teaspoon -- Pharyngeal -- Pharyngeal- Honey Cup -- Pharyngeal -- Pharyngeal- Nectar Teaspoon -- Pharyngeal -- Pharyngeal- Nectar Cup -- Pharyngeal -- Pharyngeal- Nectar Straw -- Pharyngeal -- Pharyngeal- Thin Teaspoon -- Pharyngeal -- Pharyngeal- Thin Cup Delayed swallow initiation-vallecula;Delayed swallow initiation-pyriform sinuses Pharyngeal -- Pharyngeal- Thin Straw Delayed swallow initiation-pyriform sinuses;Delayed swallow initiation-vallecula Pharyngeal -- Pharyngeal- Puree Delayed swallow initiation-vallecula Pharyngeal -- Pharyngeal- Mechanical Soft -- Pharyngeal -- Pharyngeal- Regular Delayed swallow initiation-vallecula Pharyngeal -- Pharyngeal- Multi-consistency -- Pharyngeal -- Pharyngeal- Pill -- Pharyngeal -- Pharyngeal Comment --  CHL IP CERVICAL ESOPHAGEAL PHASE 08/15/2017 Cervical Esophageal Phase WFL Pudding Teaspoon -- Pudding Cup -- Honey Teaspoon -- Honey Cup -- Nectar Teaspoon -- Nectar Cup -- Nectar Straw -- Thin Teaspoon -- Thin Cup -- Thin Straw -- Puree -- Mechanical Soft -- Regular -- Multi-consistency -- Pill -- Cervical Esophageal Comment -- Sonia Baller, MA, CCC-SLP 08/15/17 12:58 PM              [place] Core Biopsy (lymph Nodes)  Result Date: 08/12/2017 CLINICAL DATA:  Right lung mass with extensive lymphadenopathy, brain metastases and metastatic soft tissue masses of the  right chest wall. EXAM: ULTRASOUND GUIDED CORE BIOPSY OF RIGHT CHEST WALL MASS MEDICATIONS: 1.0 mg IV Versed; 50 mcg IV Fentanyl Total Moderate Sedation Time: 12 minutes. The patient's level of consciousness and physiologic status were continuously monitored during the procedure by Radiology nursing. PROCEDURE: The procedure, risks, benefits,  and alternatives were explained to the patient. Questions regarding the procedure were encouraged and answered. The patient understands and consents to the procedure. A time out was performed prior to initiating the procedure. The right lateral chest wall was prepped with chlorhexidine in a sterile fashion, and a sterile drape was applied covering the operative field. A sterile gown and sterile gloves were used for the procedure. Local anesthesia was provided with 1% Lidocaine. Soft tissue masses in the right lateral chest wall were localized by ultrasound. Under ultrasound guidance, a total of 6 separate 16 gauge core biopsy samples were obtained. Core biopsy samples were submitted in both formalin and saline. COMPLICATIONS: None. FINDINGS: There are multiple adjacent soft tissue masses in the right lateral chest wall just lateral to the pectoral musculature and near the axilla. The largest measures approximately 2.5 cm in greatest diameter. Solid tissue was obtained. IMPRESSION: Ultrasound core biopsy performed a right lateral chest wall mass. Electronically Signed   By: Aletta Edouard M.D.   On: 08/12/2017 13:20       Subjective: No complaints.  Discharge Exam: Vitals:   08/21/17 0619 08/21/17 0811  BP: 109/77   Pulse: 89   Resp: 12   Temp: 98.2 F (36.8 C)   SpO2: 96% 96%   Vitals:   08/20/17 2044 08/20/17 2202 08/21/17 0619 08/21/17 0811  BP:  (!) 87/59 109/77   Pulse:  91 89   Resp:  13 12   Temp:  97.8 F (36.6 C) 98.2 F (36.8 C)   TempSrc:  Oral Oral   SpO2: 98% 94% 96% 96%  Weight:      Height:        General: Pt is alert, awake, not  in acute distress Cardiovascular: RRR, S1/S2 +, no rubs, no gallops Respiratory: CTA bilaterally, no wheezing, no rhonchi Abdominal: Soft, NT, ND, bowel sounds + Extremities: no edema, no cyanosis    The results of significant diagnostics from this hospitalization (including imaging, microbiology, ancillary and laboratory) are listed below for reference.     Microbiology: No results found for this or any previous visit (from the past 240 hour(s)).   Labs: BNP (last 3 results) No results for input(s): BNP in the last 8760 hours. Basic Metabolic Panel: Recent Labs  Lab 08/15/17 0414 08/16/17 0401 08/17/17 0426 08/18/17 0707 08/19/17 0424  NA 136 140 142 141 146*  K 3.7 4.0 4.4 4.0 4.5  CL 102 105 107 107 111  CO2 20* 21* 20* 20* 19*  GLUCOSE 127* 104* 115* 103* 127*  BUN 23* 26* 30* 28* 29*  CREATININE 0.81 0.86 0.85 0.79 0.88  CALCIUM 9.7 9.8 10.0 10.2 10.5*   Liver Function Tests: No results for input(s): AST, ALT, ALKPHOS, BILITOT, PROT, ALBUMIN in the last 168 hours. No results for input(s): LIPASE, AMYLASE in the last 168 hours. No results for input(s): AMMONIA in the last 168 hours. CBC: Recent Labs  Lab 08/16/17 0401 08/17/17 0426 08/19/17 0424  WBC 14.1* 14.1* 15.7*  NEUTROABS  --   --  14.2*  HGB 13.7 13.2 13.7  HCT 40.5 40.5 41.6  MCV 97.8 97.8 98.6  PLT 283 284 250   Cardiac Enzymes: No results for input(s): CKTOTAL, CKMB, CKMBINDEX, TROPONINI in the last 168 hours. BNP: Invalid input(s): POCBNP CBG: Recent Labs  Lab 08/14/17 2139  GLUCAP 119*   D-Dimer No results for input(s): DDIMER in the last 72 hours. Hgb A1c No results for input(s): HGBA1C in the last 72 hours. Lipid  Profile No results for input(s): CHOL, HDL, LDLCALC, TRIG, CHOLHDL, LDLDIRECT in the last 72 hours. Thyroid function studies No results for input(s): TSH, T4TOTAL, T3FREE, THYROIDAB in the last 72 hours.  Invalid input(s): FREET3 Anemia work up No results for  input(s): VITAMINB12, FOLATE, FERRITIN, TIBC, IRON, RETICCTPCT in the last 72 hours. Urinalysis    Component Value Date/Time   COLORURINE YELLOW 08/09/2017 2137   APPEARANCEUR CLEAR 08/09/2017 2137   LABSPEC 1.018 08/09/2017 2137   PHURINE 5.0 08/09/2017 2137   GLUCOSEU NEGATIVE 08/09/2017 2137   HGBUR NEGATIVE 08/09/2017 2137   BILIRUBINUR NEGATIVE 08/09/2017 2137   KETONESUR NEGATIVE 08/09/2017 2137   PROTEINUR NEGATIVE 08/09/2017 2137   UROBILINOGEN 1.0 08/23/2013 0710   NITRITE NEGATIVE 08/09/2017 2137   LEUKOCYTESUR NEGATIVE 08/09/2017 2137   Sepsis Labs Invalid input(s): PROCALCITONIN,  WBC,  LACTICIDVEN Microbiology No results found for this or any previous visit (from the past 240 hour(s)).   Time coordinating discharge: Over 30 minutes  SIGNED:   [place] Cousins, MD  [facility] 08/21/2017, 10:12 AM Pager   If 7PM-7AM, please contact night-coverage [email or web address] Password [email or web address]

## 2017-08-21 NOTE — Progress Notes (Signed)
OT Cancellation Note  Patient Details Name: Kirk Carson MRN: [id number] DOB: [birth year]   Cancelled Treatment:    Reason Eval/Treat Not Completed: Other (comment).  Pt with medical decline and plan for home hospice.Will sign off.  Donne Robillard 08/21/2017, 9:49 AM  Lesle Chris, OTR/L [phone] 08/21/2017

## 2017-08-21 NOTE — Progress Notes (Signed)
  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     Indication for treatment:  Palliation       Radiation treatment dates:   08/14/2017 to 08/20/2017  Site/dose:   1. The whole brain was treated to 12 Gy in 4 fractions of 3 Gy 2. The RUL lung was treated to 12 Gy in 4 fractions of 3 Gy   Beams/energy:    1. Right and Left radiation fields were treated using 6 MV X-rays with custom MLC collimation to shield the eyes and face.  The patient was immobilized with a thermoplastic mask and isocenter was verified with weekly port films. 2. Oblique fields were delivered with 2 gantry positions with weekly port films   Narrative: Prognosis was quite poor and patient did not have interest in continuing treatment. The patient enrolled in hospice care.   Plan: The patient has discontinued radiation treatment and enrolled in hospice care.  I advised them to call or return if they have any questions or concerns related to their recovery or treatment. ________________________________  Sheral Apley. Tammi Klippel, M.D.  This document serves as a record of services personally performed by Tyler Pita, MD. It was created on his behalf by Arlyce Harman, a trained medical scribe. The creation of this record is based on the scribe's personal observations and the provider's statements to them. This document has been checked and approved by the attending provider.

## 2017-08-24 ENCOUNTER — Ambulatory Visit: Payer: Self-pay

## 2017-08-25 ENCOUNTER — Ambulatory Visit: Payer: Self-pay

## 2017-08-25 ENCOUNTER — Encounter (HOSPITAL_COMMUNITY): Payer: Self-pay | Admitting: Internal Medicine

## 2017-08-26 ENCOUNTER — Ambulatory Visit: Payer: Self-pay

## 2017-08-27 ENCOUNTER — Ambulatory Visit: Payer: Self-pay

## 2017-08-28 ENCOUNTER — Ambulatory Visit: Payer: Self-pay

## 2017-09-10 ENCOUNTER — Encounter (HOSPITAL_COMMUNITY): Payer: Self-pay | Admitting: Internal Medicine

## 2017-09-15 ENCOUNTER — Ambulatory Visit: Payer: Self-pay | Admitting: Internal Medicine

## 2017-09-15 ENCOUNTER — Other Ambulatory Visit: Payer: Self-pay

## 2020-04-23 IMAGING — CT CT ANGIO CHEST
2 of 8 series · 17 of 36 positions shown · IV contrast (iopamidol)
Comparison: CXR 08/09/2017

CLINICAL DATA: Dyspnea

EXAM:
CT ANGIOGRAPHY CHEST WITH CONTRAST
TECHNIQUE: Multidetector CT imaging of the chest was performed using the
standard protocol during bolus administration of intravenous
contrast. Multiplanar CT image reconstructions and MIPs were
obtained to evaluate the vascular anatomy.
CONTRAST:  74 cc [id number] IOPAMIDOL ([id number]) INJECTION 76%

[Series 6: thins · axial · 0.63mm/px · z∈[+1254,+1492]mm · 16 of 267 slices shown]
[im 15/267  lung]
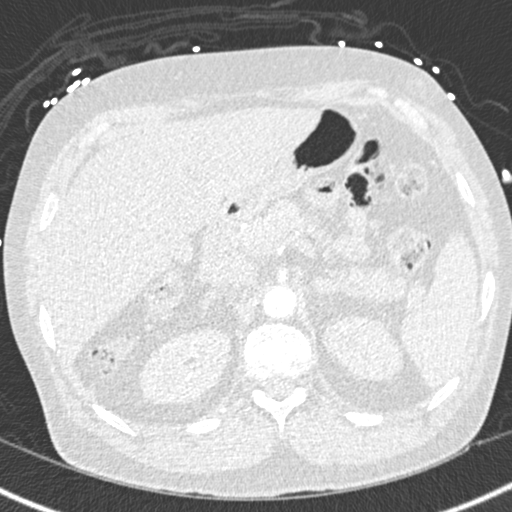
[im 29/267  mediastinal]
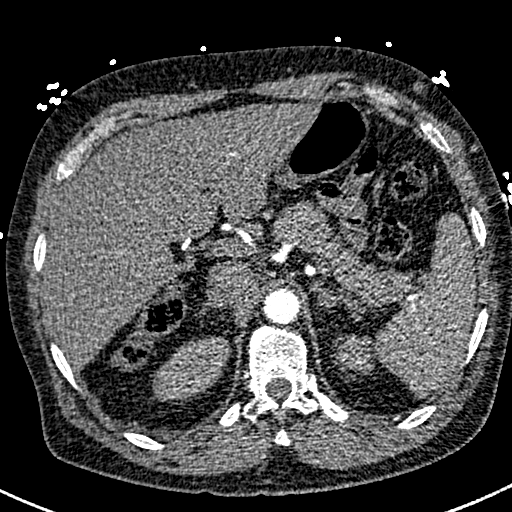
[im 43/267  lung]
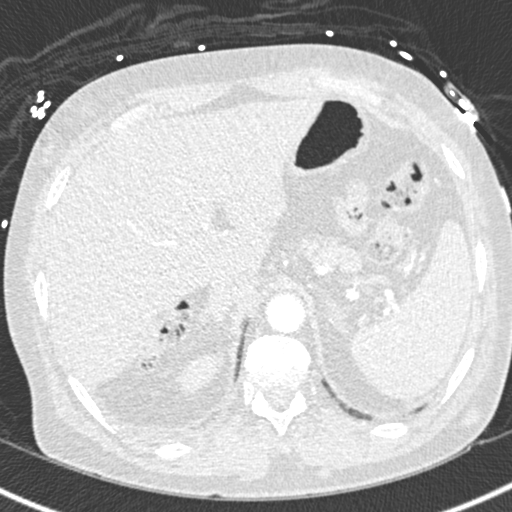
[im 57/267  mediastinal]
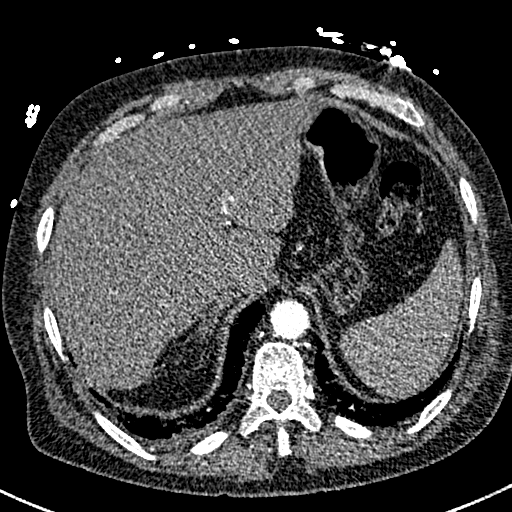
[im 85/267  lung]
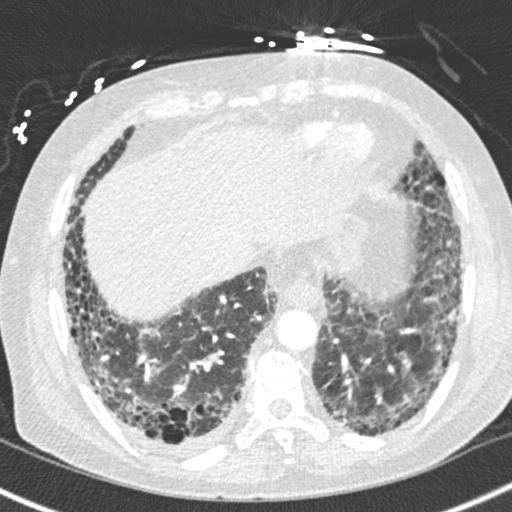
[im 99/267  mediastinal]
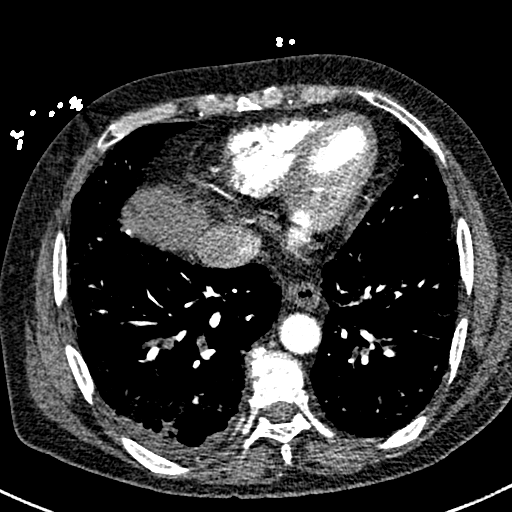
[im 113/267  lung]
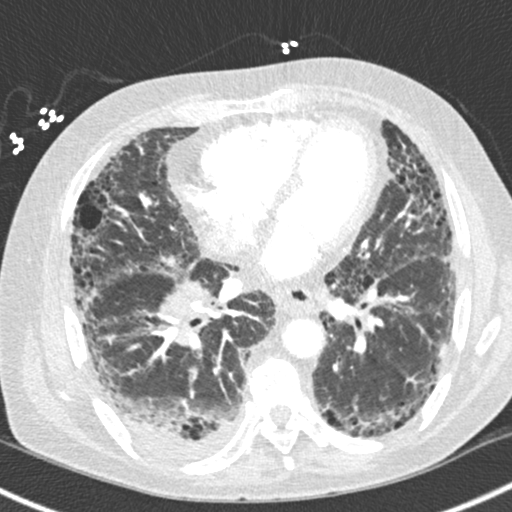
[im 127/267  mediastinal]
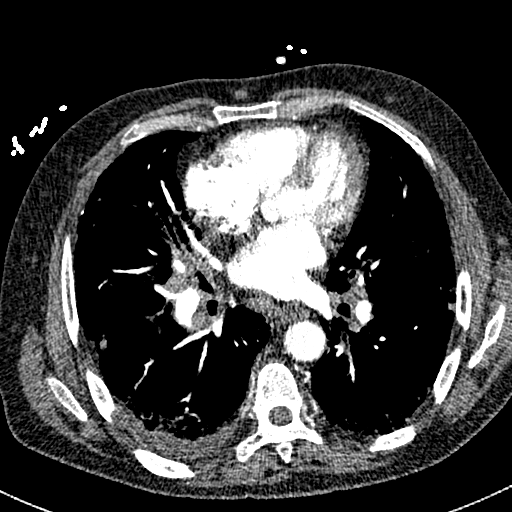
[im 141/267  lung]
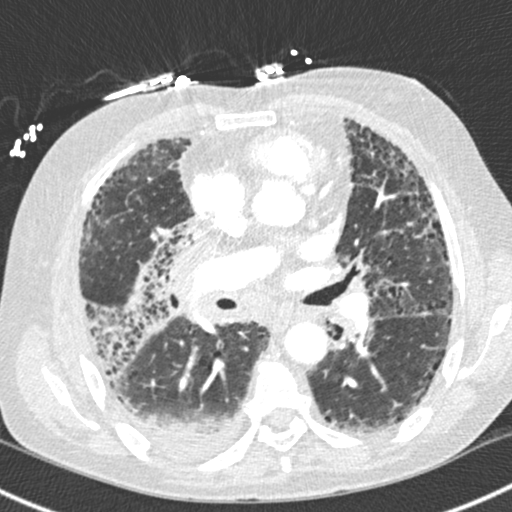
[im 155/267  mediastinal]
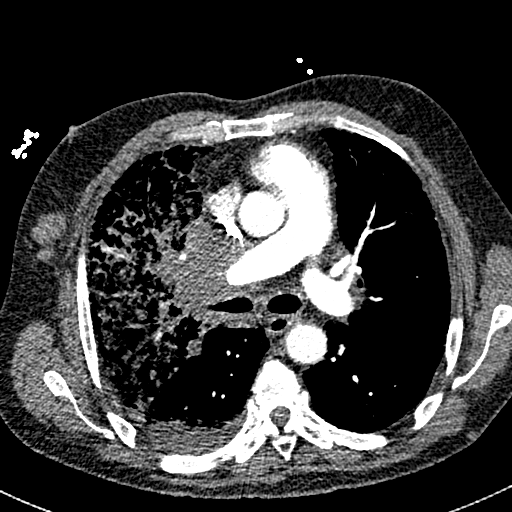
[im 169/267  lung]
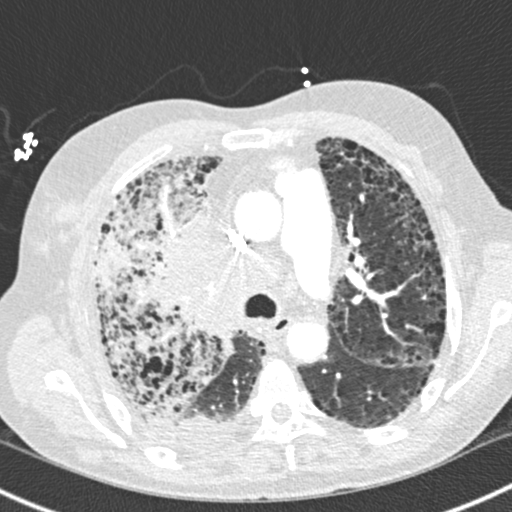
[im 183/267  mediastinal]
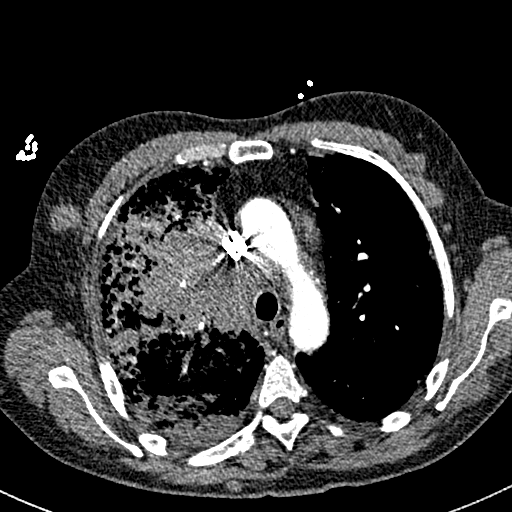
[im 211/267  lung]
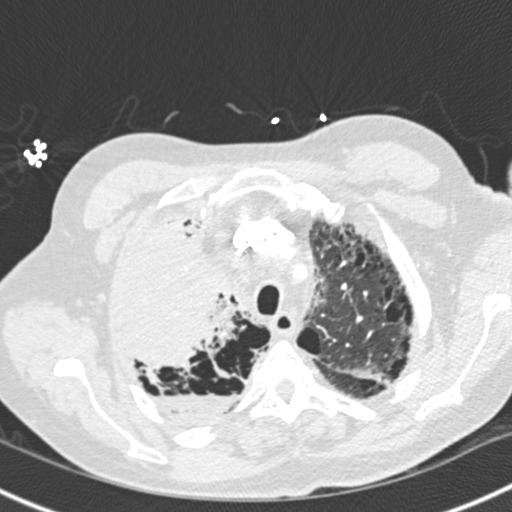
[im 225/267  mediastinal]
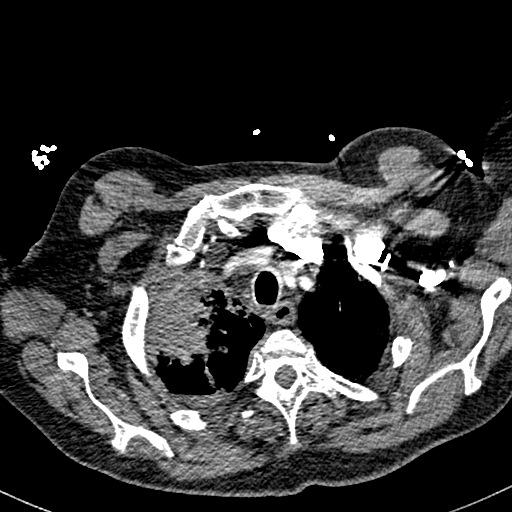
[im 239/267  lung]
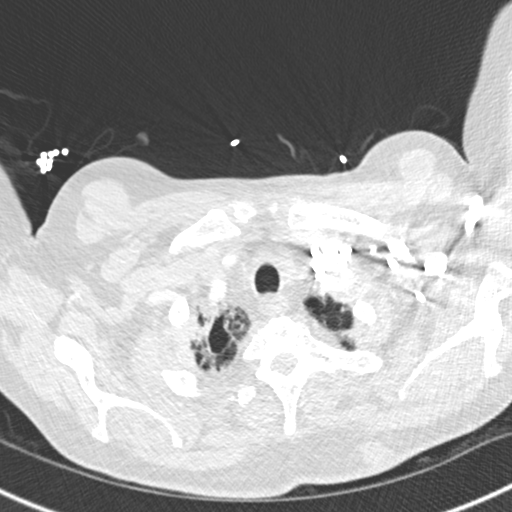
[im 253/267  mediastinal]
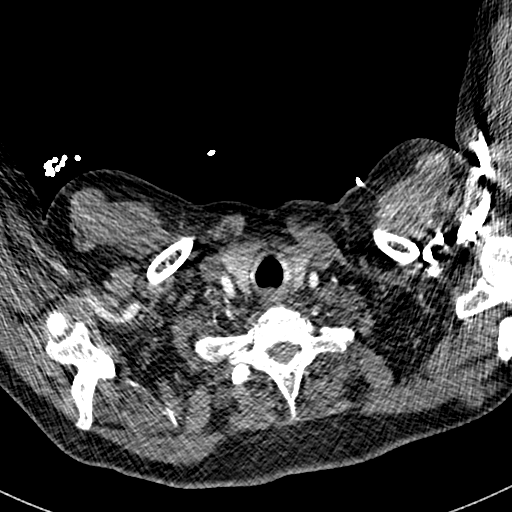

[Series 8: coronal mpr · coronal · 0.52mm/px · 1 of 146 slices shown]
[im 73/146  mediastinal]
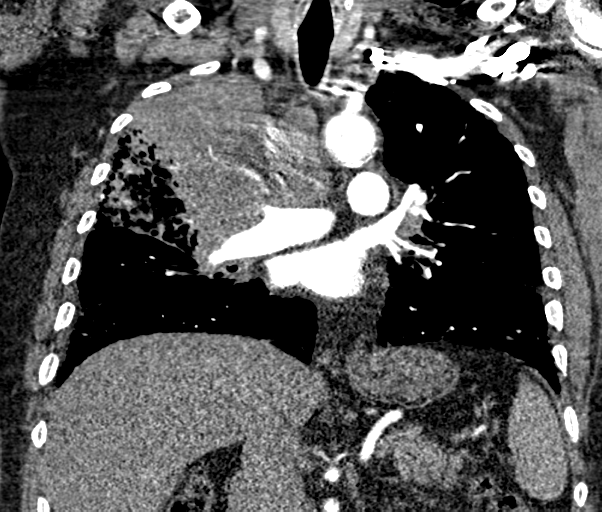

[17 of 36 positions shown; findings below may reference images not displayed]

FINDINGS: Cardiovascular: Heart is within normal limits for size. No
pericardial effusion. Minimal left main and three-vessel coronary
arteriosclerosis. No acute pulmonary embolus. The pulmonary vessels
to the right upper lobe are attenuated in appearance secondary to
large right paramediastinal and suprahilar masslike abnormality
likely representing an enlarged lymph node mass, estimated at 5.3 x
7.3 x 7.8 cm. No aortic aneurysm or dissection.

Mediastinum/Nodes: Mediastinal adenopathy measuring 1.7 cm short
axis in the subcarinal station, 2.3 cm in right lower paratracheal
station, 2.1 cm right upper paratracheal station with bilateral
hilar lymphadenopathy measuring up to 1.5 cm on the right and 1.2 cm
short axis on the left. Confluent soft tissue masslike abnormality
abutting the right superior mediastinum extending to the suprahilar
portion of the mediastinum as above described likely represents a
confluent lymph node mass attenuating the upper lobe pulmonary
vessels and distal right mainstem bronchus. The thyroid gland is
unremarkable. Midline trachea is identified. Luminal narrowing of
the distal right mainstem bronchus secondary to a right
paramediastinal/perihilar soft tissue mass is identified.

Lungs/Pleura: Centrilobular and paraseptal emphysema with large
confluent masslike abnormality centered within the right upper lobe
measuring at least 8.9 x 7.7 x 5.5 cm. Small right effusion.
Subpleural areas of interstitial prominence and fibrosis. No
definite mass identified of the left lung.

Upper Abdomen: Innumerable subcutaneous metastatic nodules scattered
throughout the visualized anterior, right lateral and posterior
chest wall. The largest is approximately 2.3 cm on series [DATE] in
the right anterolateral chest wall.

Musculoskeletal: Degenerative disc disease of the upper and
midthoracic spine without aggressive osseous lesion noted.

Review of the MIP images confirms the above findings.
IMPRESSION: 1. Extensive centrilobular and paraseptal emphysema throughout both
lungs.
2. An 8.9 x 7.7 x 5.5 cm pulmonary mass centered within the right
upper lobe is identified with confluent right paramediastinal,
mediastinal and bilateral hilar lymphadenopathy with scattered areas
of postobstructive pulmonary consolidation as well. Small right
effusion is noted. Suspect findings are related to primary
bronchogenic carcinoma with metastatic lymphadenopathy in the
setting of COPD. Lymphoma may be within the differential though
believed less likely.
3. Scattered metastatic soft tissue nodules noted of the chest wall,
the largest is anterolateral measuring 2.3 cm.
4. Coronary arteriosclerosis and aortic atherosclerosis.
5. No acute pulmonary embolus.

Aortic Atherosclerosis (2LQXH-KDH.H) and Emphysema (2LQXH-RQS.F).

## 2020-04-23 IMAGING — CT CT HEAD W/O CM
3 of 4 series · 14 of 47 positions shown, 16 images · non-contrast
Comparison: Prior MRI from 08/23/2013.

CLINICAL DATA: Initial evaluation for acute ataxia, stroke
suspected.

EXAM:
CT HEAD WITHOUT CONTRAST
TECHNIQUE: Contiguous axial images were obtained from the base of the skull
through the vertex without intravenous contrast.

[Series 4: head 2.0 h70h · axial · 0.45mm/px · z∈[-70,+52]mm · 8 of 77 slices shown, 10 images]
[im 8/77  brain]
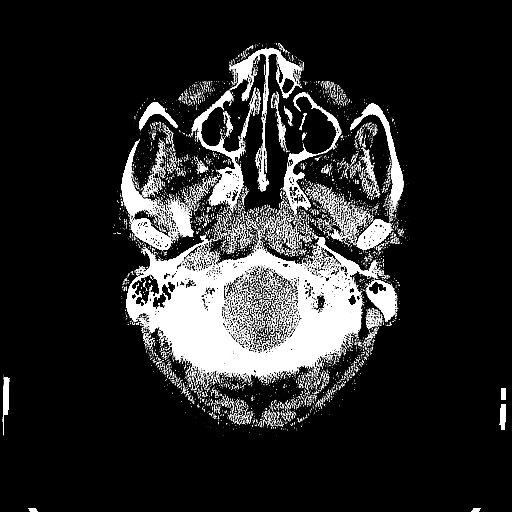
[im 8/77  bone]
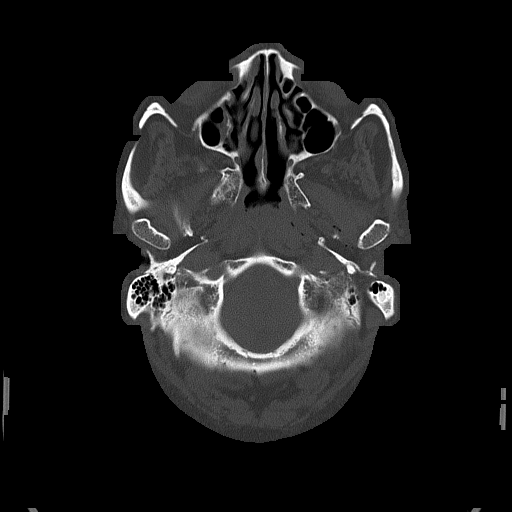
[im 15/77  brain]
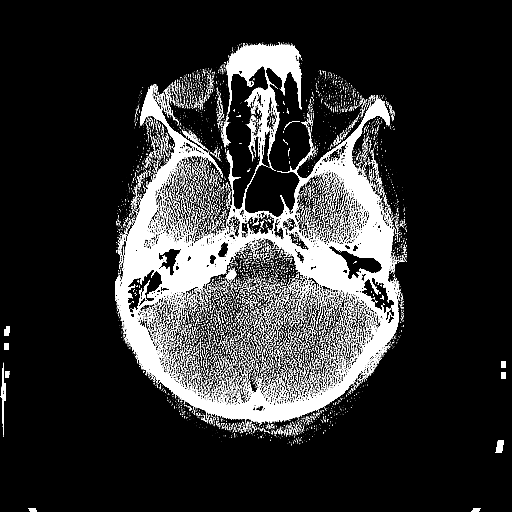
[im 26/77  brain]
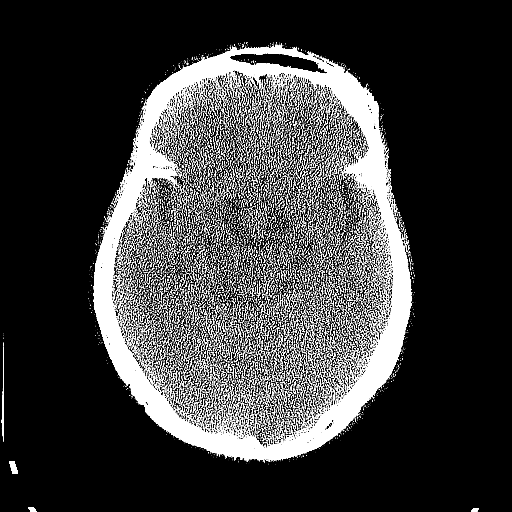
[im 33/77  brain]
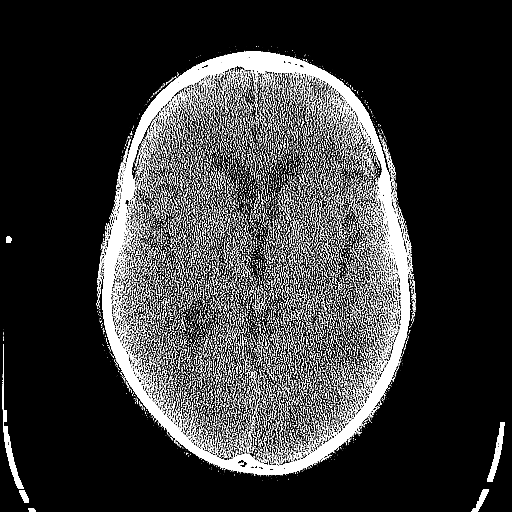
[im 44/77  brain]
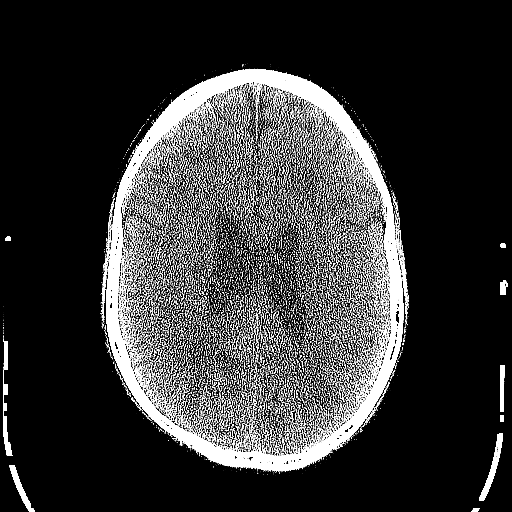
[im 44/77  bone]
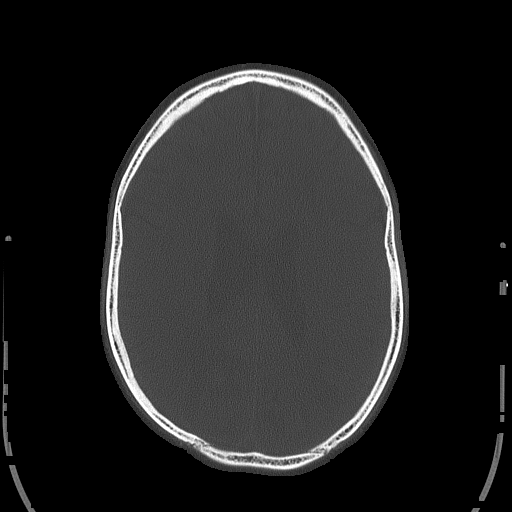
[im 51/77  brain]
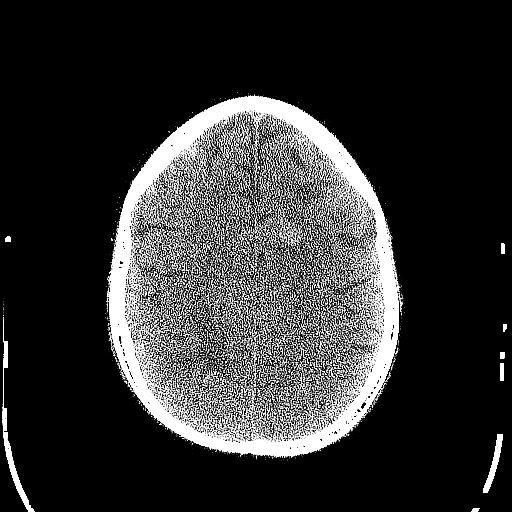
[im 62/77  brain]
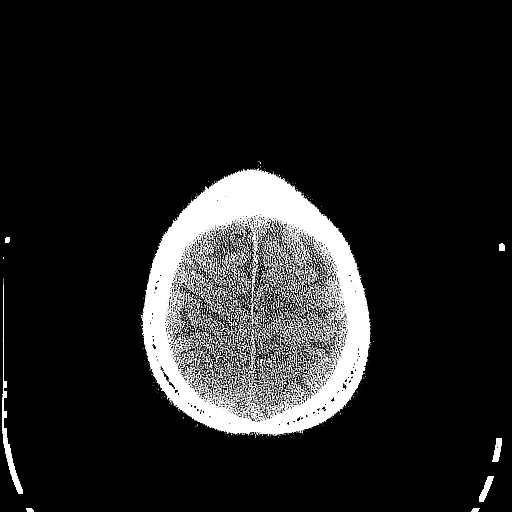
[im 69/77  brain]
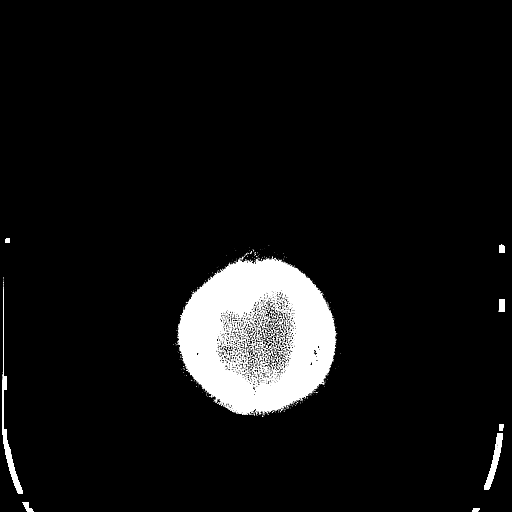

[Series 5: head 3.0 mpr cor · coronal · 0.30mm/px · 3 of 71 slices shown]
[im 24/71  brain]
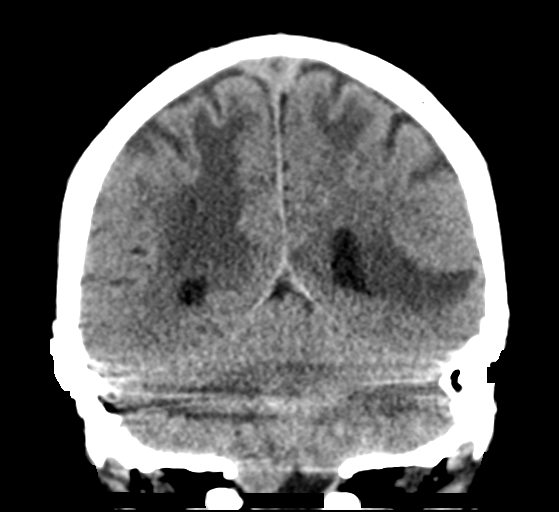
[im 32/71  brain]
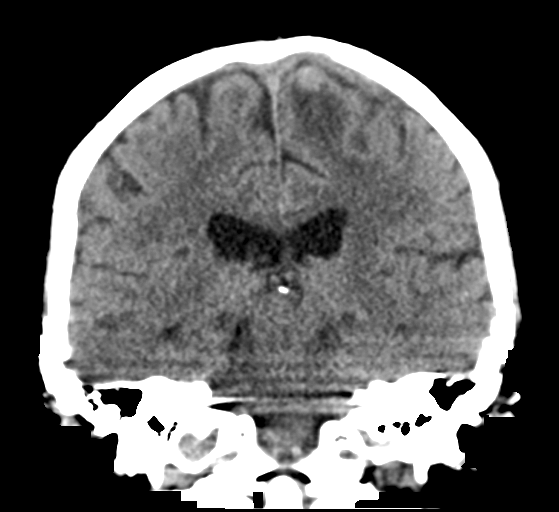
[im 39/71  brain]
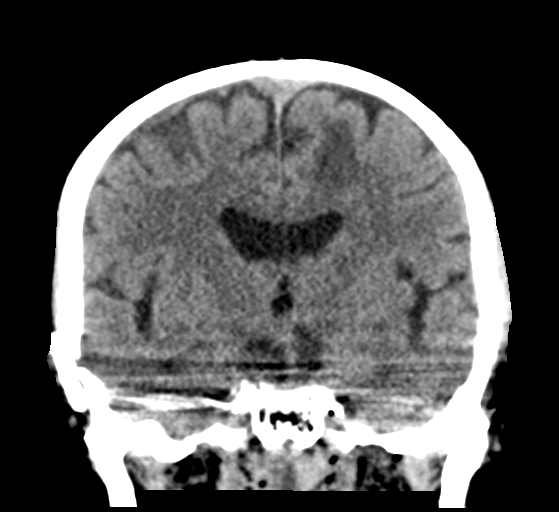

[Series 6: head 3.0 mpr sag · sagittal · 0.30mm/px · 3 of 57 slices shown]
[im 19/57  brain]
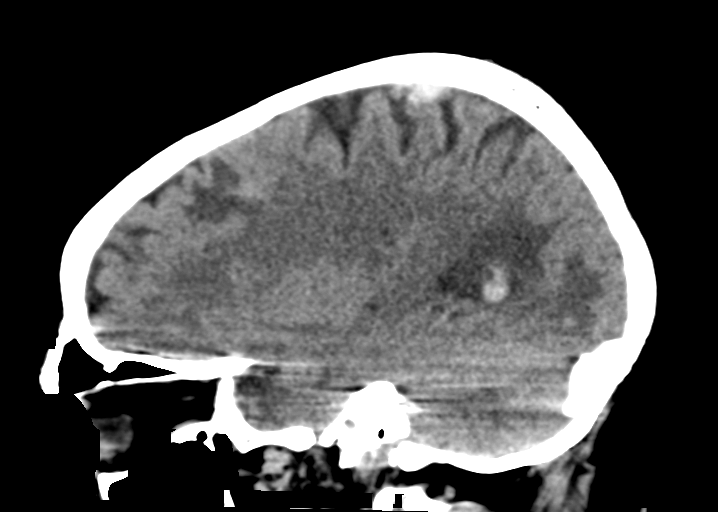
[im 29/57  brain]
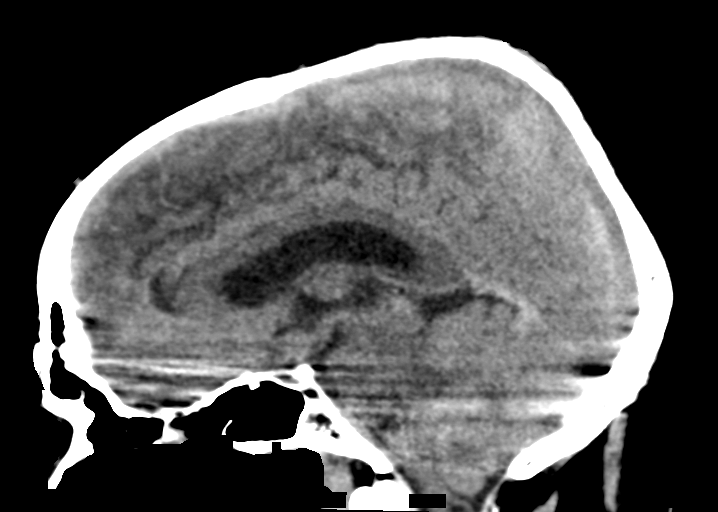
[im 38/57  brain]
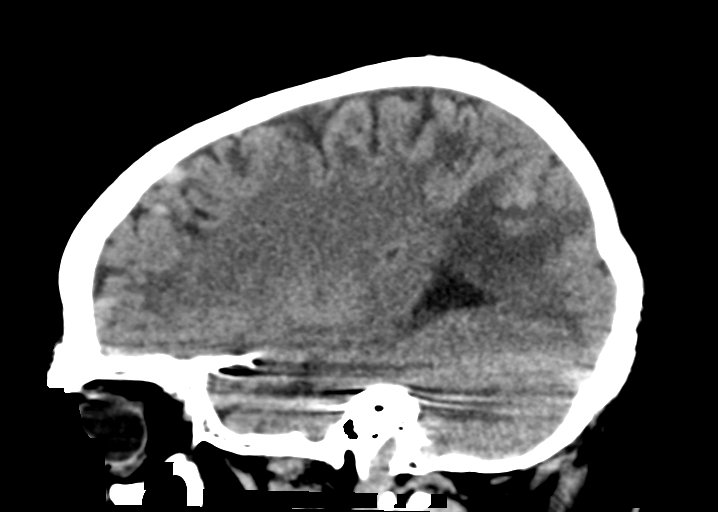

[14 of 47 positions shown; findings below may reference images not displayed]

FINDINGS: Brain: Study mildly degraded by motion artifact.

Multiple hyperdense mass lesions are seen scattered throughout the
bilateral cerebral hemispheres, consistent with metastatic disease.
For reference purposes, largest lesion on the left seen air the left
frontoparietal vertex and measures 13 mm (series 3, image 26).
Largest lesion on the right position within the right
parieto-occipital region and measures approximately 2.5 cm (series
5, image 54). Largest lesion on the left position within the left
temporoparietal region and measures approximately 2.4 cm. 6 mm
lesion noted at the dorsal left midbrain (series 3, image 12). 10 mm
left cerebellar lesion (series 3, image 6). Lesions are hyperdense
in appearance, and may be partially hemorrhagic in nature. Localized
vasogenic edema about several of the lesions without midline shift
or significant mass effect.

Underlying cerebral atrophy with mild chronic small vessel ischemic
disease. No acute large vessel territory infarct. No definite
extra-axial collection on this motion degraded exam. Ventricles
normal in size without hydrocephalus. Crowding at the basilar
cisterns without frank tentorial herniation.

Vascular: No appreciable hyperdense vessel.

Skull: Scalp soft tissues within normal limits. Calvarium intact. No
focal osseous lesions.

Sinuses/Orbits: Globes and orbital soft tissues within normal
limits. Paranasal sinuses are clear. Changes related chronic
maxillary sinusitis noted. No mastoid effusion.

Other: None.
IMPRESSION: Innumerable hyperdense lesions scattered throughout the
supratentorial and infratentorial brain, consistent with metastatic
disease. Localized vasogenic edema about several of the lesions
without significant mass effect or midline shift. Several of these
lesions are hyperdense in appearance, and may be partially
hemorrhagic. Further evaluation with dedicated brain MRI and
metastatic workup recommended.
